# Patient Record
Sex: Male | Born: 1945
Health system: Southern US, Community
[De-identification: ages and names within clinical notes are randomized; demographics above are authoritative.]

## PROBLEM LIST (undated history)

## (undated) DIAGNOSIS — I2510 Atherosclerotic heart disease of native coronary artery without angina pectoris: Secondary | ICD-10-CM

## (undated) DIAGNOSIS — R002 Palpitations: Secondary | ICD-10-CM

## (undated) DIAGNOSIS — N419 Inflammatory disease of prostate, unspecified: Secondary | ICD-10-CM

## (undated) DIAGNOSIS — I10 Essential (primary) hypertension: Secondary | ICD-10-CM

## (undated) DIAGNOSIS — E785 Hyperlipidemia, unspecified: Secondary | ICD-10-CM

## (undated) DIAGNOSIS — K219 Gastro-esophageal reflux disease without esophagitis: Secondary | ICD-10-CM

## (undated) DIAGNOSIS — I493 Ventricular premature depolarization: Secondary | ICD-10-CM

## (undated) HISTORY — DX: Essential (primary) hypertension: I10

## (undated) HISTORY — DX: Atherosclerotic heart disease of native coronary artery without angina pectoris: I25.10

## (undated) HISTORY — DX: Hyperlipidemia, unspecified: E78.5

## (undated) HISTORY — PX: TONSILLECTOMY: SUR1361

## (undated) HISTORY — DX: Ventricular premature depolarization: I49.3

## (undated) HISTORY — PX: OTHER SURGICAL HISTORY: SHX169

## (undated) HISTORY — DX: Palpitations: R00.2

## (undated) HISTORY — PX: APPENDECTOMY: SHX54

## (undated) HISTORY — DX: Inflammatory disease of prostate, unspecified: N41.9

## (undated) HISTORY — DX: Gastro-esophageal reflux disease without esophagitis: K21.9

---

## 1999-07-25 ENCOUNTER — Ambulatory Visit (HOSPITAL_COMMUNITY): Admission: RE | Admit: 1999-07-25 | Discharge: 1999-07-25 | Payer: Self-pay | Admitting: Gastroenterology

## 1999-10-17 ENCOUNTER — Ambulatory Visit (HOSPITAL_COMMUNITY): Admission: RE | Admit: 1999-10-17 | Discharge: 1999-10-17 | Payer: Self-pay | Admitting: Gastroenterology

## 1999-10-17 ENCOUNTER — Encounter: Payer: Self-pay | Admitting: Gastroenterology

## 2001-12-19 ENCOUNTER — Ambulatory Visit (HOSPITAL_COMMUNITY): Admission: RE | Admit: 2001-12-19 | Discharge: 2001-12-19 | Payer: Self-pay | Admitting: Family Medicine

## 2001-12-19 ENCOUNTER — Encounter: Payer: Self-pay | Admitting: Family Medicine

## 2003-08-02 ENCOUNTER — Encounter (INDEPENDENT_AMBULATORY_CARE_PROVIDER_SITE_OTHER): Payer: Self-pay | Admitting: *Deleted

## 2003-08-02 ENCOUNTER — Ambulatory Visit (HOSPITAL_COMMUNITY): Admission: RE | Admit: 2003-08-02 | Discharge: 2003-08-02 | Payer: Self-pay | Admitting: Gastroenterology

## 2005-10-11 ENCOUNTER — Ambulatory Visit: Payer: Self-pay | Admitting: Infectious Diseases

## 2005-10-11 ENCOUNTER — Observation Stay (HOSPITAL_COMMUNITY): Admission: EM | Admit: 2005-10-11 | Discharge: 2005-10-13 | Payer: Self-pay | Admitting: Emergency Medicine

## 2005-10-12 ENCOUNTER — Encounter: Payer: Self-pay | Admitting: Cardiology

## 2005-10-12 ENCOUNTER — Ambulatory Visit: Payer: Self-pay | Admitting: Cardiology

## 2005-10-27 ENCOUNTER — Ambulatory Visit (HOSPITAL_COMMUNITY): Admission: RE | Admit: 2005-10-27 | Discharge: 2005-10-27 | Payer: Self-pay | Admitting: Gastroenterology

## 2005-10-28 ENCOUNTER — Encounter: Admission: RE | Admit: 2005-10-28 | Discharge: 2005-10-28 | Payer: Self-pay | Admitting: Gastroenterology

## 2006-06-01 ENCOUNTER — Emergency Department (HOSPITAL_COMMUNITY): Admission: EM | Admit: 2006-06-01 | Discharge: 2006-06-01 | Payer: Self-pay | Admitting: Family Medicine

## 2006-08-15 IMAGING — US US ABDOMEN COMPLETE
1 series · 14 of 25 positions shown · non-contrast
Comparison: none

CLINICAL DATA: Abdominal pain. Reflux. 
 ABDOMINAL ULTRASOUND COMPLETE:
TECHNIQUE: Complete abdominal ultrasound examination was performed including evaluation of the liver, gallbladder, bile ducts, pancreas, kidneys, spleen, IVC, and abdominal aorta.

[Series 1: us abdomen complete · 0.37mm/px · 14 of 62 slices shown]
[im 1/62]
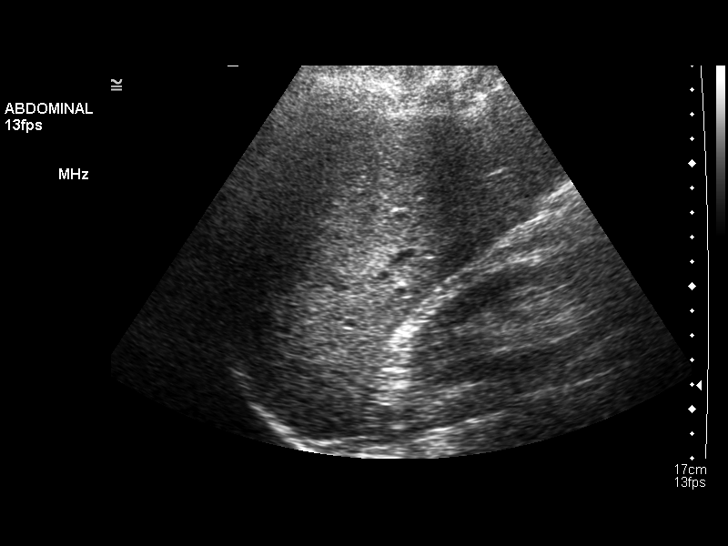
[im 6/62]
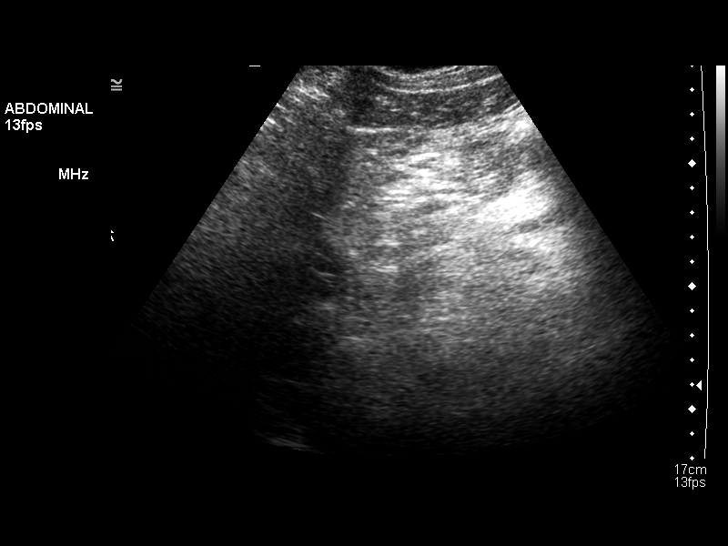
[im 11/62]
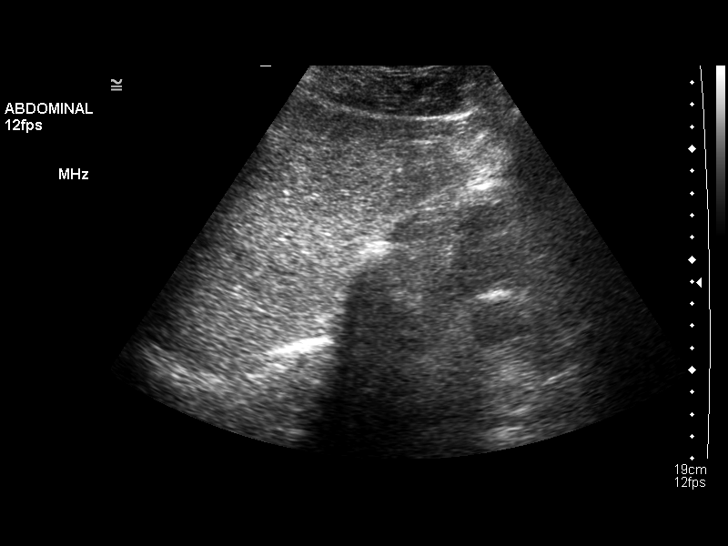
[im 16/62]
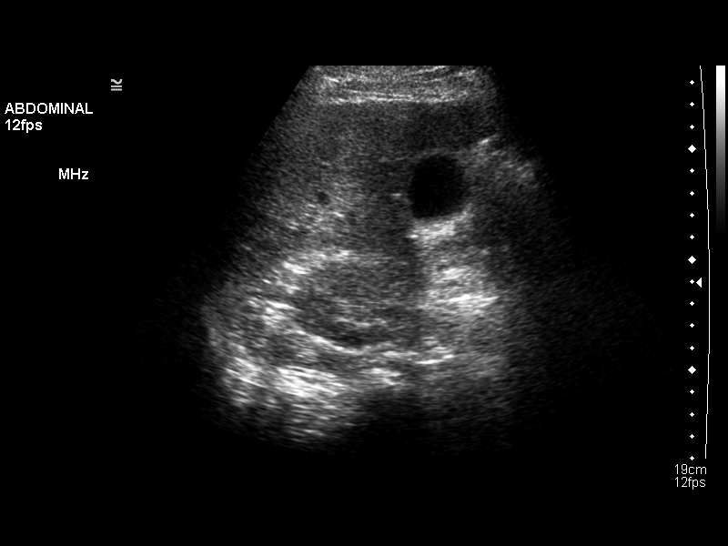
[im 21/62]
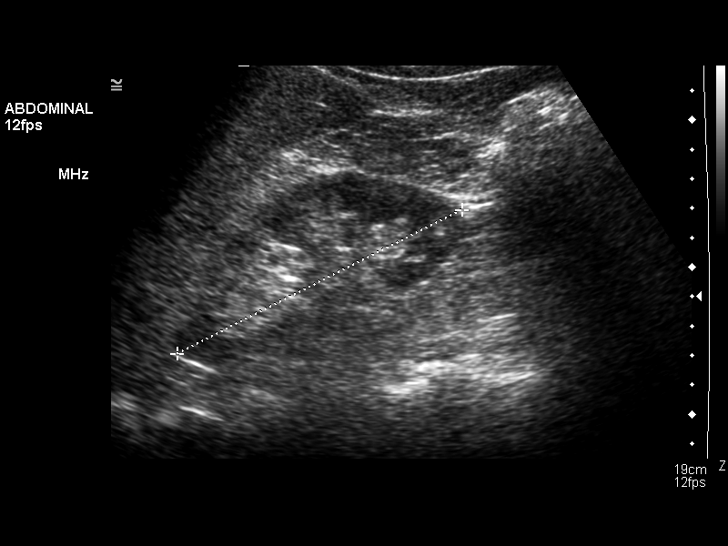
[im 23/62]
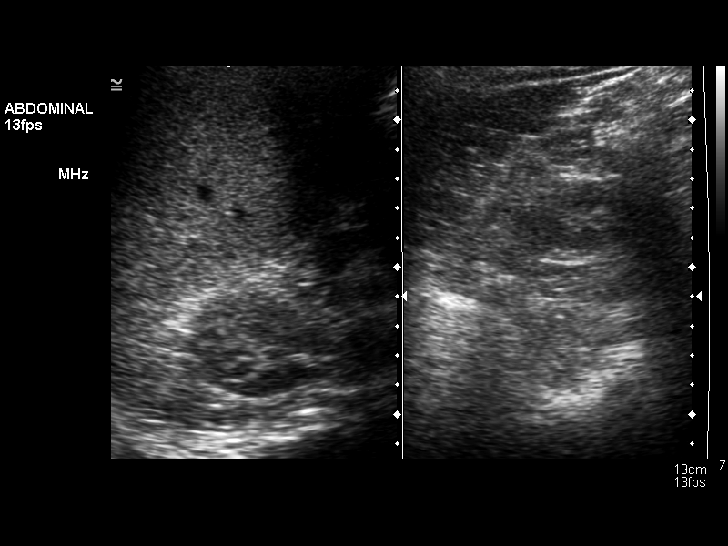
[im 28/62]
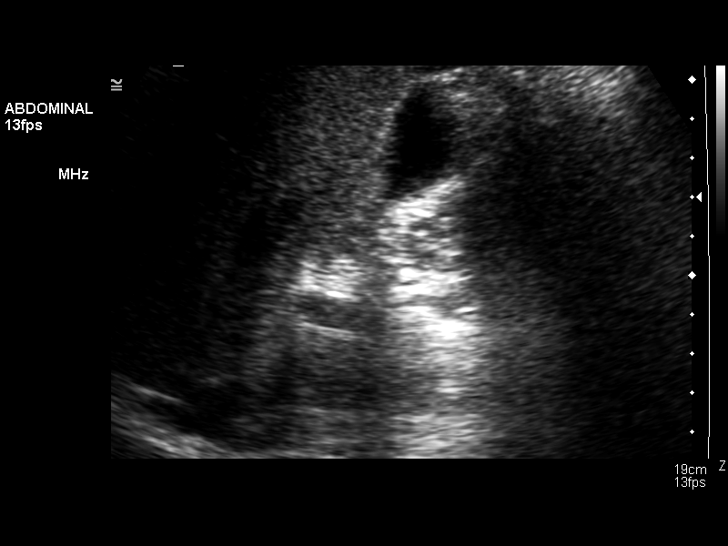
[im 34/62]
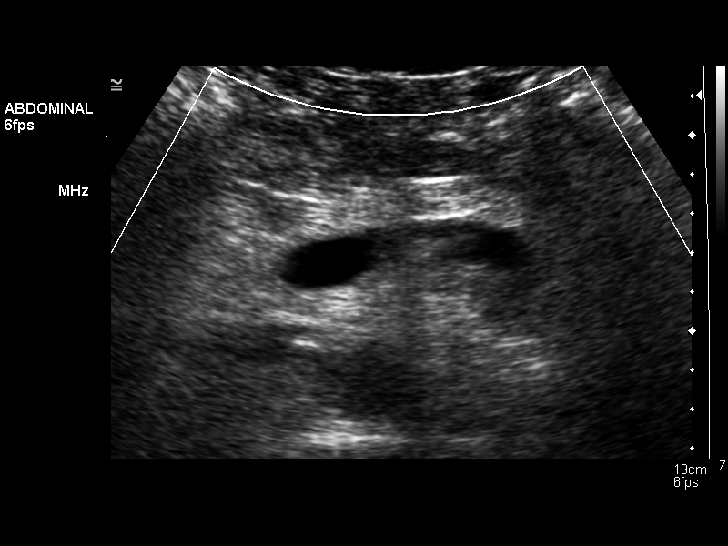
[im 39/62]
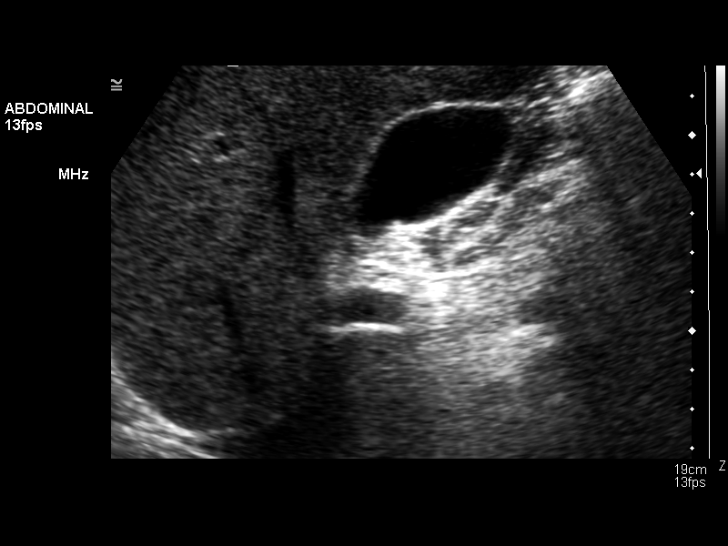
[im 41/62]
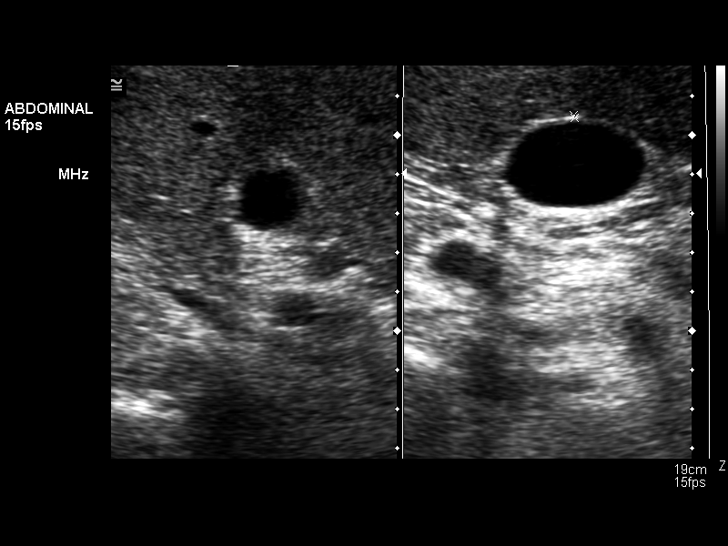
[im 46/62]
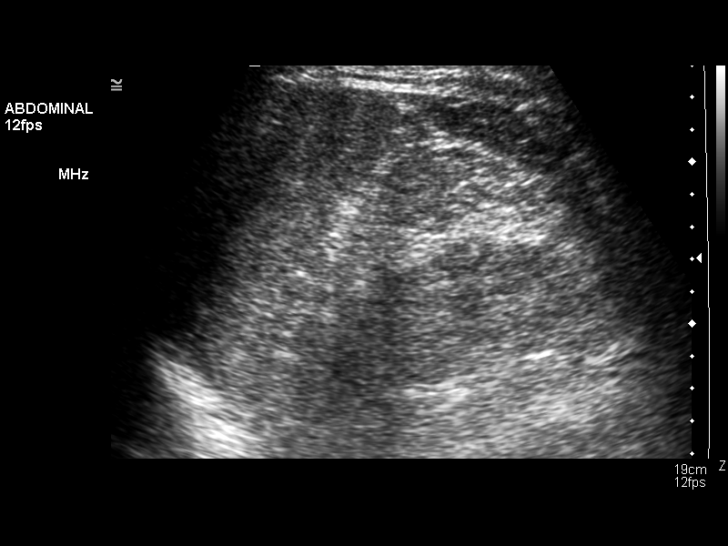
[im 51/62]
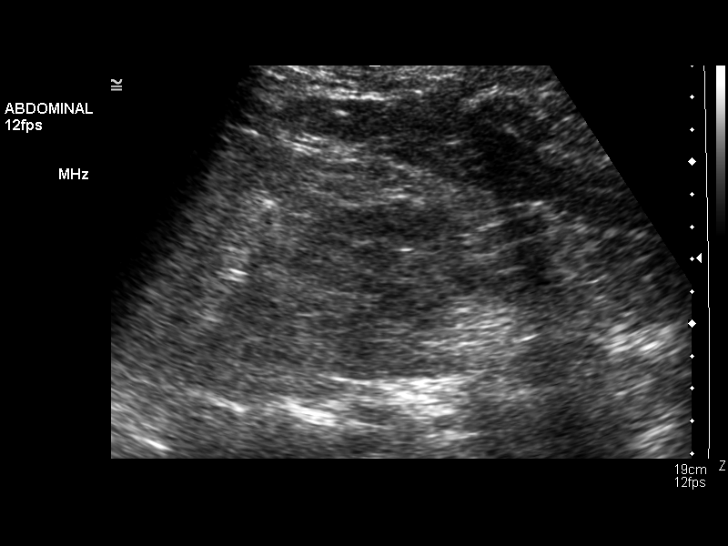
[im 56/62]
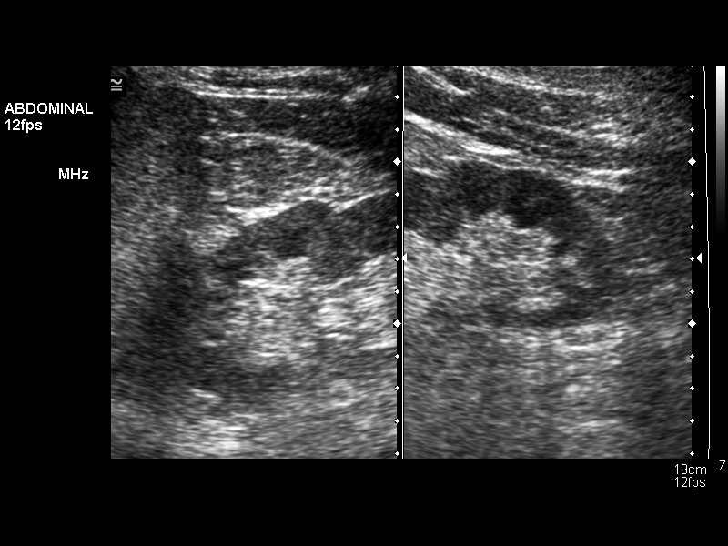
[im 62/62]
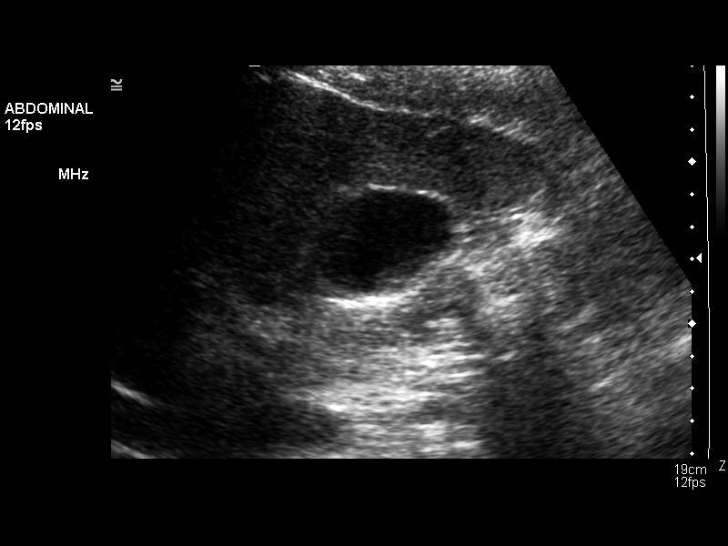

[14 of 25 positions shown; findings below may reference images not displayed]

FINDINGS: Scans over the upper abdomen were performed.  The gallbladder is well seen and no gallstones are noted.   The liver has increased echogenicity consistent with fatty infiltration.  The common bile duct is normal measuring .7 mm in diameter.  Assessment of the IVC, pancreas and spleen is limited by bowel gas.  No hydronephrosis is seen.  The right kidney measures 10.9 cm sagittally with the left kidney measuring 10.4 cm.  The abdominal aorta is normal in caliber.
IMPRESSION: 1.  Fatty infiltration of the liver.   No gallstones. 
 2.  Pancreas and spleen not well seen due to bowel gas.

## 2007-09-28 ENCOUNTER — Ambulatory Visit (HOSPITAL_COMMUNITY): Admission: RE | Admit: 2007-09-28 | Discharge: 2007-09-28 | Payer: Self-pay | Admitting: Gastroenterology

## 2007-12-01 HISTORY — PX: US ECHOCARDIOGRAPHY: HXRAD669

## 2007-12-01 HISTORY — PX: OTHER SURGICAL HISTORY: SHX169

## 2008-09-25 ENCOUNTER — Ambulatory Visit: Payer: Self-pay | Admitting: Family Medicine

## 2008-09-25 ENCOUNTER — Observation Stay (HOSPITAL_COMMUNITY): Admission: EM | Admit: 2008-09-25 | Discharge: 2008-09-26 | Payer: Self-pay | Admitting: Emergency Medicine

## 2008-09-26 ENCOUNTER — Encounter: Payer: Self-pay | Admitting: Family Medicine

## 2011-03-01 HISTORY — PX: CARDIAC CATHETERIZATION: SHX172

## 2011-03-24 ENCOUNTER — Telehealth: Payer: Self-pay | Admitting: Cardiology

## 2011-03-24 NOTE — Telephone Encounter (Signed)
Agree with plan 

## 2011-03-24 NOTE — Telephone Encounter (Signed)
When I called patient back he stated his heart rate was back to normal.  He stated he ran around the building and it went back to normal and that he would discontinue his caffeine completely and if it happened again he would call back.  Stated he would just watch for about a week and call back if needed anything.

## 2011-03-24 NOTE — Telephone Encounter (Signed)
Patient wants to know if he should come in for his PVC sx's.  He wants to speak with RN to see if he should just wait another 24 hrs.

## 2011-03-30 ENCOUNTER — Telehealth: Payer: Self-pay | Admitting: Cardiology

## 2011-03-30 ENCOUNTER — Inpatient Hospital Stay (HOSPITAL_COMMUNITY)
Admission: EM | Admit: 2011-03-30 | Discharge: 2011-03-31 | DRG: 392 | Disposition: A | Discharge status: Home / Self Care | Payer: 59 | Source: Ambulatory Visit | Attending: Cardiology | Admitting: Cardiology

## 2011-03-30 ENCOUNTER — Encounter: Payer: Self-pay | Admitting: Nurse Practitioner

## 2011-03-30 ENCOUNTER — Ambulatory Visit (INDEPENDENT_AMBULATORY_CARE_PROVIDER_SITE_OTHER): Payer: 59 | Admitting: Nurse Practitioner

## 2011-03-30 ENCOUNTER — Inpatient Hospital Stay (HOSPITAL_COMMUNITY): Payer: 59

## 2011-03-30 ENCOUNTER — Encounter: Payer: Self-pay | Admitting: *Deleted

## 2011-03-30 VITALS — BP 120/82 | HR 70 | Wt 200.0 lb

## 2011-03-30 DIAGNOSIS — I10 Essential (primary) hypertension: Secondary | ICD-10-CM | POA: Diagnosis present

## 2011-03-30 DIAGNOSIS — K219 Gastro-esophageal reflux disease without esophagitis: Principal | ICD-10-CM | POA: Diagnosis present

## 2011-03-30 DIAGNOSIS — R079 Chest pain, unspecified: Secondary | ICD-10-CM

## 2011-03-30 DIAGNOSIS — R0789 Other chest pain: Secondary | ICD-10-CM | POA: Diagnosis present

## 2011-03-30 DIAGNOSIS — I2510 Atherosclerotic heart disease of native coronary artery without angina pectoris: Secondary | ICD-10-CM | POA: Diagnosis present

## 2011-03-30 DIAGNOSIS — E785 Hyperlipidemia, unspecified: Secondary | ICD-10-CM | POA: Diagnosis present

## 2011-03-30 DIAGNOSIS — Z7982 Long term (current) use of aspirin: Secondary | ICD-10-CM

## 2011-03-30 HISTORY — DX: Atherosclerotic heart disease of native coronary artery without angina pectoris: I25.10

## 2011-03-30 LAB — CBC
HCT: 43.3 % (ref 39.0–52.0)
Hemoglobin: 14.8 g/dL (ref 13.0–17.0)
MCH: 30.5 pg (ref 26.0–34.0)
MCHC: 34.2 g/dL (ref 30.0–36.0)
MCV: 89.3 fL (ref 78.0–100.0)
Platelets: 176 10*3/uL (ref 150–400)
RBC: 4.85 MIL/uL (ref 4.22–5.81)
RDW: 13.5 % (ref 11.5–15.5)
WBC: 6.1 10*3/uL (ref 4.0–10.5)

## 2011-03-30 LAB — DIFFERENTIAL
Basophils Relative: 0 % (ref 0–1)
Eosinophils Absolute: 0.1 10*3/uL (ref 0.0–0.7)
Eosinophils Relative: 2 % (ref 0–5)
Lymphocytes Relative: 16 % (ref 12–46)
Lymphs Abs: 1 10*3/uL (ref 0.7–4.0)
Monocytes Absolute: 0.6 10*3/uL (ref 0.1–1.0)
Monocytes Relative: 11 % (ref 3–12)
Neutro Abs: 4.3 10*3/uL (ref 1.7–7.7)
Neutrophils Relative %: 71 % (ref 43–77)

## 2011-03-30 LAB — CARDIAC PANEL(CRET KIN+CKTOT+MB+TROPI)
CK, MB: 1.2 ng/mL (ref 0.3–4.0)
Relative Index: 1.5 (ref 0.0–2.5)
Relative Index: INVALID (ref 0.0–2.5)
Total CK: 101 U/L (ref 7–232)
Total CK: 82 U/L (ref 7–232)
Troponin I: <0.01 ng/mL (ref 0.00–0.06)
Troponin I: 0.01 ng/mL (ref 0.00–0.06)

## 2011-03-30 LAB — COMPREHENSIVE METABOLIC PANEL
ALT: 17 U/L (ref 0–53)
AST: 23 U/L (ref 0–37)
Albumin: 3.6 g/dL (ref 3.5–5.2)
Alkaline Phosphatase: 70 U/L (ref 39–117)
BUN: 15 mg/dL (ref 6–23)
CO2: 22 mEq/L (ref 19–32)
Chloride: 110 mEq/L (ref 96–112)
Creatinine, Ser: 1 mg/dL (ref 0.4–1.5)
GFR calc Af Amer: >60 mL/min (ref >60)
GFR calc non Af Amer: >60 mL/min (ref >60)
Glucose, Bld: 93 mg/dL (ref 70–99)
Potassium: 4 mEq/L (ref 3.5–5.1)
Sodium: 139 mEq/L (ref 135–145)
Total Bilirubin: 0.5 mg/dL (ref 0.3–1.2)
Total Protein: 6.5 g/dL (ref 6.0–8.3)

## 2011-03-30 LAB — APTT: aPTT: 30 seconds (ref 24–37)

## 2011-03-30 LAB — PROTIME-INR: INR: 0.95 (ref 0.00–1.49)

## 2011-03-30 NOTE — Progress Notes (Signed)
   Randy Wise Date of Birth: 04-May-1946   History of Present Illness: Randy Wise is seen today for a work in visit. He is seen for Dr. Patty Sermons. He has not been feeling well over the last 2 weeks. He has gotten progressively worse over the last 2 days. He has exertional chest tightness. It has been associated with severe sense of fatigue. It is described as a tightness and like indigestion. He has not eaten anything to give him indigestion. Overall, he does not feel well and does not have a sense of well being. He has been nauseasted off and on. He has some shortness of breath. His symptoms are relieved with rest and clearly brought on by exertion.  No radiation of the symptoms. He has not used NTG. He has a history of PVC's but notes that his heart rate is currently smooth, yet he remains uncomfortable. Yesterday he attempted to walk up a hill and broke out in a cold sweat.   Current Outpatient Prescriptions on File Prior to Visit  Medication Sig Dispense Refill  . aspirin 81 MG tablet Take 81 mg by mouth daily.        [name] omeprazole (PRILOSEC OTC) 20 MG tablet Take 20 mg by mouth daily.          No Known Allergies  Past Medical History  Diagnosis Date  . Palpitations   . Chest pain   . Prostatitis   . HTN (hypertension)     Past Surgical History  Procedure Date  . Tonsillectomy   . Appendectomy   . Fistula removal   . Nuclear stress test 2009    Ef-63%, Normal  . US echocardiography 2009    Normal, EF-65%    History  Smoking status  . Never Smoker   Smokeless tobacco  . Not on file    History  Alcohol Use No    Family History  Problem Relation Age of Onset  . Hypertension Brother   . Heart disease Father   . Heart attack Father     Review of Systems: The review of systems is positive as above.  All other systems were reviewed and are negative.  Physical Exam: BP 120/82  Pulse 70  Wt 200 lb (90.719 kg) Patient is pleasant and appears mildly  distressed.  He keeps saying that something doesn't feel right. Skin is warm and dry. Color is normal.  HEENT is unremarkable. Normocephalic/atraumatic. PERRL. Sclera are nonicteric. Neck is supple. No masses. No JVD. Lungs are clear. Cardiac exam shows a regular rate and rhythm. Abdomen is soft. Extremities are without edema. Gait and ROM are intact. No gross neurologic deficits noted.  LABORATORY DATA: EKG shows sinus. There are no acute changes. Tracing is reviewed with Dr. Patty Sermons. ? Changes in V2   Assessment / Plan:

## 2011-03-30 NOTE — Assessment & Plan Note (Signed)
Patient was given NTG x 1 dose with some relief. He does not "feel quite right". Plan of care is reviewed with Dr. Patty Sermons. We will admit to the hospital. We will make arrangements for him to have cardiac cath today. Orders are written. Patient is transported per EMS.

## 2011-03-30 NOTE — Telephone Encounter (Signed)
Worked in with Lori today 

## 2011-03-30 NOTE — Telephone Encounter (Signed)
PATIENT SAID HE HAS BEEN HAVE PALPS OFTEN AND NOW CONCERNED. HE WENT WITHOUT CAFFIENE ALL WEEKEND BUT DOES NOT SEEM TO HAVE MADE A DIFFERENCE. CHART PLACED IN BOX.

## 2011-03-31 LAB — CBC
HCT: 41.5 % (ref 39.0–52.0)
MCH: 29.9 pg (ref 26.0–34.0)
MCHC: 34 g/dL (ref 30.0–36.0)
MCV: 88.1 fL (ref 78.0–100.0)
Platelets: 164 10*3/uL (ref 150–400)
RBC: 4.71 MIL/uL (ref 4.22–5.81)
RDW: 13.5 % (ref 11.5–15.5)
WBC: 4.4 10*3/uL (ref 4.0–10.5)

## 2011-03-31 LAB — BASIC METABOLIC PANEL
Calcium: 8.7 mg/dL (ref 8.4–10.5)
Chloride: 108 mEq/L (ref 96–112)
Creatinine, Ser: 1.09 mg/dL (ref 0.4–1.5)
GFR calc Af Amer: >60 mL/min (ref >60)
GFR calc non Af Amer: >60 mL/min (ref >60)
Potassium: 4.3 mEq/L (ref 3.5–5.1)

## 2011-03-31 LAB — CARDIAC PANEL(CRET KIN+CKTOT+MB+TROPI)
CK, MB: 1.3 ng/mL (ref 0.3–4.0)
Relative Index: INVALID (ref 0.0–2.5)
Total CK: 85 U/L (ref 7–232)
Troponin I: 0.01 ng/mL (ref 0.00–0.06)

## 2011-03-31 LAB — LIPID PANEL
Cholesterol: 157 mg/dL (ref 0–200)
HDL: 32 mg/dL — ABNORMAL LOW (ref >39)
LDL Cholesterol: 107 mg/dL — ABNORMAL HIGH (ref 0–99)
Total CHOL/HDL Ratio: 4.9 RATIO

## 2011-03-31 LAB — HEPARIN LEVEL (UNFRACTIONATED): Heparin Unfractionated: <0.1 IU/mL — ABNORMAL LOW (ref 0.30–0.70)

## 2011-04-01 NOTE — Cardiovascular Report (Signed)
  NAME:  Randy Wise, Randy Wise NO.:  [id number]  MEDICAL RECORD NO.:  [id number]           PATIENT TYPE:  I  LOCATION:  2010                         FACILITY:  [facility]  PHYSICIAN:  Verne Carrow, MDDATE OF BIRTH:  Dec 07, 1945  DATE OF PROCEDURE:  03/30/2011 DATE OF DISCHARGE:                           CARDIAC CATHETERIZATION   PRIMARY CARDIOLOGIST:  Cassell Clement, MD  PROCEDURE PERFORMED: 1. Left heart catheterization. 2. Selective coronary angiography. 3. Left ventricular angiogram.  OPERATOR:  Verne Carrow, MD  ARTERIAL ACCESS SITE:  Right radial artery.  INDICATIONS:  Chest pain.  DETAILS OF PROCEDURE:  The patient was brought to the main cardiac catheterization laboratory after signing informed consent for the procedure.  An Freida Busman test was performed on the right wrist and was positive.  The right wrist was prepped and draped in sterile fashion. Lidocaine 1% was used for local anesthesia.  A 5-French sheath was inserted into the right radial artery without difficulty.  Verapamil 3 mg was given through the sheath after insertion.  Intravenous heparin 4000 units was given after sheath insertion.  Standard diagnostic catheters were used to perform selective coronary angiography.  A pigtail catheter was used to perform a left ventricular angiogram.  The patient tolerated the procedure well.  There were no immediate complications.  The sheath was removed from the right radial artery and a Terumo hemostasis band was applied over the arteriotomy site.  The patient was taken to the recovery area in stable condition.  HEMODYNAMIC FINDINGS:  Central aortic pressure 105/62.  Left ventricular pressure 107/9/15.  ANGIOGRAPHIC FINDINGS: 1. The left main coronary artery was short and had no disease. 2. The left anterior descending was a large vessel that coursed to the     apex and gave off a moderate-sized diagonal branch.  There were     mild luminal  irregularities throughout the proximal and mid vessel     but no focally obstructive lesions.  The diagonal branch had mild     plaque disease. 3. Circumflex artery gave off a large bifurcating first obtuse     marginal branch.  The AV groove circumflex beyond this was small in     caliber.  There was mild plaque noted in this system. 4. The right coronary artery is a large dominant vessel with no     evidence of disease. 5. Left ventricular angiogram was performed in the RAO projection and     showed normal left ventricular systolic function with ejection     fraction of 65%.  IMPRESSION: 1. Mild nonobstructive coronary artery disease. 2. Normal left ventricular systolic function.  RECOMMENDATIONS:  The patient should be treated with an aspirin and a statin.  He will be watched closely tonight and discharged to home tomorrow if stable.     Verne Carrow, MD     CM/MEDQ  D:  03/30/2011  T:  03/31/2011  Job:  [id number]  cc:   Cassell Clement, M.D.  Electronically Signed by Verne Carrow MD on 04/01/2011 02:11:32 PM

## 2011-04-07 NOTE — Discharge Summary (Addendum)
  NAMEDEO, MEHRINGER NO.:  [id number]  MEDICAL RECORD NO.:  [id number]           PATIENT TYPE:  I  LOCATION:  2010                         FACILITY:  [facility]  PHYSICIAN:  Cassell Clement, M.D. DATE OF BIRTH:  07-24-1946  DATE OF ADMISSION:  03/30/2011 DATE OF DISCHARGE:  03/31/2011                              DISCHARGE SUMMARY   Electronically Signed by Ronie Spies  on 04/07/2011 01:09:17 PM Electronically Signed by Cassell Clement M.D. on 04/13/2011 04:42:18 AM

## 2011-04-07 NOTE — Discharge Summary (Addendum)
[name], [name]:  [id number]  MEDICAL RECORD NO.:  [id number]           PATIENT TYPE:  I  LOCATION:  2010                         FACILITY:  [facility]  PHYSICIAN:  Cassell Clement, M.D. DATE OF BIRTH:  Jan 16, 1946  DATE OF ADMISSION:  03/30/2011 DATE OF DISCHARGE:  03/31/2011                              DISCHARGE SUMMARY   DISCHARGE DIAGNOSES: 1. Chest pain, possibly related to gastroesophageal reflux disease     plus or minus musculoskeletal chest pain. 2. Mild nonobstructive coronary artery disease by catheterization on     March 30, 2011, with normal left ventricular function.     a.     Initiated on statin, continued on aspirin.  Not on beta-      blocker secondary to baseline bradycardia. 3. Hyperlipidemia.  Total cholesterol 157, triglycerides 89, HDL 32,     LDL 107.     a.     Initiated on Crestor. 4. History of premature ventricular contractions. 5. Status post tonsillectomy. 6. Status post appendectomy. 7. Status post fistula removal.  HISTORY OF PRESENT ILLNESS:  [name] is a 65 year old gentleman with a history of GERD, prior hypertension, and normal stress test in 2009 and normal echo who presented to the office complaining of exertional chest discomfort with associated fatigue, described like indigestion-type sensation.  He broke out into cold sweat while walking up a hill.  His symptoms are concerning for stable angina, so he was brought to the [facility] for further evaluation.  Cardiac enzymes were cycled, which were negative x3.  He underwent cardiac catheterization to define his anatomy on March 30, 2011, demonstrating mild nonobstructive CAD with normal LV function.  He was initiated on statin for hyperlipidemia and his aspirin was continued.  He was initially written to receive beta blockade, however, had baseline bradycardia precluding use of this.  Dr. Patty Sermons has seen and examined him today and feels he is  stable for discharge.  In retrospect, the patient does wonder whether or not he pulled the muscle from using a chainsaw, but also relates that these symptoms are similar to prior indigestion and GERD.  Dr. Patty Sermons would like him to continue on his aspirin, Crestor, and over-the-counter Prilosec.  DISCHARGE LABORATORIES:  Cardiac enzymes negative x3.  Total cholesterol 157, triglyceride 89, HDL 32, LDL 107.  Sodium 140, potassium 4.3, chloride 108, CO2 of 28, glucose 104, BUN 9, creatinine 1.09.  LFTs were normal on March 30, 2011.  INR 0.95.  WBC 4.4, hemoglobin 14.1, hematocrit 41.5, platelet count 164.  STUDIES: 1. Chest x-ray on March 30, 2011, showed normal heart size and clear     lungs. 2. Cardiac catheterization on March 30, 2011, please see full report     for details as well as HPI for summary.  DISCHARGE MEDICATIONS: 1. Nitroglycerin sublingual 0.4 mg every 5 minutes as needed up to 3     doses for chest pain. 2. Crestor 10 mg daily. 3. Aspirin 81 mg every morning. 4. Prilosec 20 mg daily.  Please note that he is on no beta-blocker secondary to bradycardia.  DISPOSITION:  [name] will  be discharged in stable condition to home.  He is instructed to increase activity slowly, not to lift anything over 5 pounds for 1 week, or participate in sexual activity for 1 week.  No driving for 3 days.  He is to follow a heart-healthy diet, and to call or return if he notices any pain, swelling, bleeding, or pus at the cath site.  He will follow up with Dr. Aida Puffer for continued monitoring of his lipid therapy.  He will have a post hospital visit with Norma Fredrickson, NP, on Apr 06, 2011, at 9:15 a.m.  DURATION OF DISCHARGE ENCOUNTER:  Greater than 30 minutes including physician and PA time.     Ronie Spies, P.A.C.   ______________________________ Cassell Clement, M.D.    DD/MEDQ  D:  03/31/2011  T:  04/01/2011  Job:  [id number]  cc:   Cassell Clement, M.D. Aida Puffer  Electronically Signed by Ronie Spies  on 04/07/2011 01:09:19 PM Electronically Signed by Cassell Clement M.D. on 04/13/2011 04:42:30 AM

## 2011-04-14 ENCOUNTER — Encounter: Payer: Self-pay | Admitting: Nurse Practitioner

## 2011-04-14 DIAGNOSIS — K219 Gastro-esophageal reflux disease without esophagitis: Secondary | ICD-10-CM | POA: Insufficient documentation

## 2011-04-14 DIAGNOSIS — E785 Hyperlipidemia, unspecified: Secondary | ICD-10-CM | POA: Insufficient documentation

## 2011-04-14 NOTE — H&P (Signed)
NAME:  JOSEPH, BIAS NO.:  [id number]   MEDICAL RECORD NO.:  [id number]          PATIENT TYPE:  OBV   LOCATION:  4703                         FACILITY:  [facility]   PHYSICIAN:  Santiago Bumpers. Hensel, M.D.DATE OF BIRTH:  01/14/1946   DATE OF ADMISSION:  09/25/2008  DATE OF DISCHARGE:                              HISTORY & PHYSICAL   CHIEF COMPLAINT:  Cardiac palpitations, diaphoresis, and chest pain.   HISTORY OF PRESENT ILLNESS:  Yesterday, the patient had barbeque and 3  caffeinated tea drinks.  Yesterday evening at about 2200, he developed  chest pressure and felt palpitations.  He said they were worse when he  laid on his left side.  They were better when he laid on his right and  on his back.  They felt even better on his back.  He had palpitations,  eventually went away overnight, but returned this morning while he was  at work.  He was doing inventory with lifting boxes and with climbing up  ladders.  While every time he would try to climb up in the ladder, he  would feel severe palpitations and diaphoresis.  It was relieved.  His  symptoms were relieved by rest.  He did not take any medication to  relieve his symptoms.  He has had these PVCs in the past usually related  to times where he is having reflux and increased caffeinated drinks, but  he has really not had them since 2007.  The only thing that was  different today about his palpitations was that he also felt nauseated.  He vomited x1 and had diarrhea x5.  His vomit was nonbilious, nonbloody.  His stool was normal, normal-colored stools, just a little bit watery,  but no blood, no melanotic.   PAST MEDICAL HISTORY:  Significant for GERD, for internal and external  hemorrhoids, for a history of noncardiac chest pain, for cardiomegaly on  chest x-ray, and for dysphasia with history of esophageal narrowing on  swallow eval and EGD.  He also had a current prostatitis infection that  he is being treated for.   He takes no medications other than the  antibiotics for the prostatitis, which he cannot remember the name of at  this time.   He has no known drug allergies.   He has a past surgical history of an appendectomy in childhood.   He has a family history of a father who had coronary artery disease and  diabetes who is living, 64 years old.  He had his first MI at 38 and has  had 5 coronary bypass surgeries.  He has a mother who has arthritis who  is living.  She is 64 years old.  Other than that, everyone is healthy.   SOCIAL HISTORY:  He lives with his wife, his daughter, and his 2  grandchildren.  He denies use of alcohol, smoking, and drugs.  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   REVIEW OF SYSTEMS:  Only positive for increased urination.  Otherwise,  he is negative for chest pain, shortness of breath, bloody diarrhea, or  bilious vomit.   PHYSICAL EXAMINATION:  VITAL SIGNS:  Temperature 98.7, heart rate 62,  blood pressure 112/69, oxygen saturation was 100% on room air.  HEENT:  He was normocephalic, atraumatic.  He wears glasses.  His pupils  were equal, round, and reactive to light.  Vision was grossly intact.  His nares were patent.  His tympanic membranes were gray, nonbulging.  His mucous membranes were moist.  He had fair dentition.  NECK:  Supple.  CARDIOVASCULAR:  He had a regular rate and rhythm.  No murmurs.  Normal  S1 and S2.  No rubs or gallops.  ABDOMEN:  He is overweight.  He has positive bowel sounds.  No  tenderness to palpation.  No organomegaly.  EXTREMITIES.  He had positive pulses.  No edema.  Warm and well  perfused.  NEURO:  He is alert and oriented x3.  No neuro deficits.   On his labs, his sodium was 141, potassium 4.4, chloride 109, bicarb 25,  BUN 10, creatinine 1.06, and he had a blood glucose of 101.  His calcium  was 9.4.  His magnesium 2.3.  He had point-of-care cardiac enzymes that  were negative with a troponin I of 0.01,  total CK 165, CK-MB of 2.7, and  relative index of 1.6.  He had a CBC that showed a white blood cell  count of 5.4, hemoglobin 15, hematocrit 44.3, and platelets of 201.  Studies that were done in the ED consisted of chest x-ray that showed no  acute cardiopulmonary disease and an EKG x2.  One was normal sinus  rhythm and the other showed normal sinus rhythm with 3 PVCs.   ASSESSMENT AND PLAN:  This 65 year old male with palpitations,  diaphoresis, nausea, vomiting, and diarrhea.  1. Cardiovascular, palpitations.  The patient has a history of      palpitations.  We will work these up for some more recent causes      including myocardial infarction, which we will cycle his cardiac      enzymes x3, 8 hours apart.  We will also give the patient aspirin      and nitroglycerin.  We will risk stratify the patient by getting a      hemoglobin A1c and doing a fasting lipid panel in the morning.  He      will also have an echo in the morning.  These may be due to      dehydration, due to vomiting and diarrhea, so we gave the patient a      normal saline bolus.  The patient likely has gastroenteritis from      the barbeque could also be from increased caffeine intake.  He did      mention that he used to drink a lot of caffeine and had more      premature ventricular contractions at that time; however, he will      no matter what the cause will likely need followup with Cardiology.      He is to see a cardiologist named Dr. Cassell Clement that we will      try to get followup with.  2. Gastroesophageal reflux disease.  The patient has had reflux in the      past and was told that it could bring along palpitations.  He will      continue his home meds that he has not been taking, but he does      have prescription for Protonix 40 mg  b.i.d.  The patient also      mentions that he has prostatitis.  3. Prostatitis.  The patient is being treated with antibiotics, but he      could not remember the  name of the antibiotics.  His family will      phone in the name and the dosage of the antibiotic and we will      start him on that while he is in-house.  4. Fluids, electrolytes, nutrition/gastrointestinal:  With the patient      on Protonix and on a heart-healthy diet, he is on telemetry, but      will be allowed to ambulate ad lib.  5. Disposition.  Likely, the patient will go home tomorrow pending      normal results and resolution of his PVCs and provided that he has      a followup with Cardiology arranged.      Jamie Brookes, MD  Electronically Signed      Santiago Bumpers. Leveda Anna, M.D.  Electronically Signed    AS/MEDQ  D:  09/25/2008  T:  09/26/2008  Job:  [id number]

## 2011-04-14 NOTE — Op Note (Signed)
NAMETHI, SISEMORE NO.:  [id number]   MEDICAL RECORD NO.:  [id number]          PATIENT TYPE:  AMB   LOCATION:  ENDO                         FACILITY:  [facility]   PHYSICIAN:  Petra Kuba, M.D.    DATE OF BIRTH:  17-May-1946   DATE OF PROCEDURE:  09/28/2007  DATE OF DISCHARGE:                               OPERATIVE REPORT   PROCEDURE:  Colonoscopy.   INDICATION:  Patient with a history of colon polyps, family history of  colon cancer due for repeat screening.  Consent was signed after risks,  benefits, methods and options were thoroughly discussed multiple times  in the past.   MEDICINES USED:  Fentanyl 100 mcg, Versed 10 mg.   DESCRIPTION OF PROCEDURE:  Rectal inspection was pertinent for small  external hemorrhoids.  Digital exam was negative. The video pediatric  colonoscope was inserted and easily advanced around the colon to the  cecum.  This did require abdominal pressure but no position changes.  No  abnormalities were seen on insertion.  The cecum was identified by the  appendiceal orifice and the ileocecal valve. The scope was slowly  withdrawn.  The prep was adequate.  There was some liquid stool that  required washing and suctioning but on slow withdrawal through the colon  no abnormalities were seen, specifically no polyps, tumors, masses or  diverticula.  Once back in the rectum, anorectal pull-through and  retroflexion confirmed some small hemorrhoids. The scope was  straightened and readvanced a short ways up the left side of the colon,  air was suctioned, scope removed.  The patient tolerated the procedure  well.  There was no obvious immediate complication.   ENDOSCOPIC DIAGNOSES:  1. Internal and external small hemorrhoids.  2. Otherwise within normal limits to the cecum.   PLAN:  Happy to see back p.r.n. otherwise repeat colon screening in 5  years.           ______________________________  Petra Kuba, M.D.     MEM/MEDQ  D:   09/28/2007  T:  09/28/2007  Job:  [id number]   cc:   Windle Guard, M.D.  Fax: [phone]

## 2011-04-16 ENCOUNTER — Encounter: Payer: Self-pay | Admitting: Nurse Practitioner

## 2011-04-16 ENCOUNTER — Ambulatory Visit (INDEPENDENT_AMBULATORY_CARE_PROVIDER_SITE_OTHER): Payer: 59 | Admitting: Nurse Practitioner

## 2011-04-16 VITALS — BP 132/88 | HR 62 | Ht 66.0 in | Wt 202.4 lb

## 2011-04-16 DIAGNOSIS — E785 Hyperlipidemia, unspecified: Secondary | ICD-10-CM

## 2011-04-16 DIAGNOSIS — I2510 Atherosclerotic heart disease of native coronary artery without angina pectoris: Secondary | ICD-10-CM | POA: Insufficient documentation

## 2011-04-16 MED ORDER — ATORVASTATIN CALCIUM 10 MG PO TABS: 10.0000 mg | ORAL_TABLET | Freq: Every day | ORAL | Status: AC

## 2011-04-16 NOTE — Progress Notes (Signed)
    Randy Wise Date of Birth: [birth year]   History of Present Illness: [name] is seen today for a post hospital visit.He is seen for Dr. Patty Wise. He has done well post cath. He has mild nonobstructive CAD. He will be managed medically. He was started on PPI therapy and Crestor. He cannot afford the Crestor. He has had no recurrent chest pain. He was cathed via the radial artery and did fine. He has no complaint.  Current Outpatient Prescriptions on File Prior to Visit  Medication Sig Dispense Refill  . aspirin 81 MG tablet Take 81 mg by mouth daily.        . fish oil-omega-3 fatty acids 1000 MG capsule Take by mouth 2 (two) times daily.        . nitroGLYCERIN (NITROSTAT) 0.4 MG SL tablet Place 0.4 mg under the tongue every 5 (five) minutes as needed.        [name] omeprazole (PRILOSEC OTC) 20 MG tablet Take 20 mg by mouth daily.        [name] atorvastatin (LIPITOR) 10 MG tablet Take 1 tablet (10 mg total) by mouth daily.  30 tablet  11  . DISCONTD: rosuvastatin (CRESTOR) 10 MG tablet Take 10 mg by mouth daily.          No Known Allergies  Past Medical History  Diagnosis Date  . Palpitations   . Prostatitis   . HTN (hypertension)   . GERD (gastroesophageal reflux disease)   . Hyperlipidemia   . CAD (coronary artery disease) 03/30/11    Mild nonobstructive disease per cath    Past Surgical History  Procedure Date  . Tonsillectomy   . Appendectomy   . Fistula removal   . Nuclear stress test 2009    Ef-63%, Normal  . US echocardiography 2009    Normal, EF-65%  . Cardiac catheterization April 2012    mild nonobstructive CAD with normal LV function. Left ventricular angiogram was performed in the RAO projection and showed normal left ventricular systolic function with ejection fraction of 65%    History  Smoking status  . Never Smoker   Smokeless tobacco  . Never Used    History  Alcohol Use No    Family History  Problem Relation Age of Onset  . Hypertension  Brother   . Heart disease Father   . Heart attack Father     Review of Systems: The review of systems is as above.  All other systems were reviewed and are negative.  Physical Exam: BP 132/88  Pulse 62  Ht 5\' 6"  (1.676 m)  Wt 202 lb 6.4 oz (91.808 kg)  BMI 32.67 kg/m2 Patient is very pleasant and in no acute distress. Skin is warm and dry. Color is normal.  HEENT is unremarkable. Normocephalic/atraumatic. PERRL. Sclera are nonicteric. Neck is supple. No masses. No JVD. Lungs are clear. Cardiac exam shows a regular rate and rhythm. Abdomen is soft. Extremities are without edema. Right arm looks good Gait and ROM are intact. No gross neurologic deficits noted.  LABORATORY DATA:   Assessment / Plan:

## 2011-04-16 NOTE — Patient Instructions (Signed)
We will try Lipitor 10 mg daily for your cholesterol Go to the website [email or web address] to see if you can get it for $4. We will see you in about 6 weeks for labs only. See Dr. Patty Sermons in about 4 months.

## 2011-04-16 NOTE — Assessment & Plan Note (Signed)
He cannot afford Crestor. We will try him on Lipitor. I have asked him to go to their website and see if he can get this for $4. We will recheck labs in 6 weeks.

## 2011-04-16 NOTE — Assessment & Plan Note (Signed)
He is doing well. We will see him back in about 4 months.

## 2011-04-17 NOTE — Discharge Summary (Signed)
NAME:  Randy Wise, Randy Wise NO.:  [id number]   MEDICAL RECORD NO.:  [id number]          PATIENT TYPE:  OBV   LOCATION:  4703                         FACILITY:  [facility]   PHYSICIAN:  Santiago Bumpers. Hensel, M.D.DATE OF BIRTH:  [birth year]   DATE OF ADMISSION:  09/25/2008  DATE OF DISCHARGE:  09/26/2008                               DISCHARGE SUMMARY   PRIMARY CARE Julianah Marciel:  Windle Guard, MD   DISCHARGE DIAGNOSES:  1. Cardiovascular chest pain and palpitations.  2. Gastroesophageal reflux disease.  3. Prostatitis.   DISCHARGE MEDICATIONS:  1. Doxycycline 100 mg p.o. b.i.d.  2. Aspirin 81 mg p.o. daily.  3. Protonix 40 mg p.o. b.i.d.  4. Nitroglycerin 0.4 mg sublingual tabs for chest pain.   Of note, these were the medications that the patient came in on as well.   There were no consults during this hospitalization.   PROCEDURES:  On September 25, 2008, the patient had two EKGs which both  showed a normal sinus rhythm, one of them showed three PVCs as well.  He  also had a chest x-ray that showed no acute cardiopulmonary disease on  September 25, 2008.  The patient's labs showed a BMET that had a sodium of  141, a potassium of 4.4, chloride 109, bicarb 25, BUN 10 and creatinine  1.06.  He had a glucose of 101, calcium of 9.4, magnesium of 2.3.  He  had point-of-care enzymes that showed a troponin I of 0.01, total CK  165, CK-MB of 2.7 and relative index of 1.6.  He had a white blood cell  count of 5.4, hemoglobin of 15, a hematocrit of 44.3 and platelets of  201.   BRIEF HOSPITAL COURSE:  This is a patient who had been feeling some  heart palpitations in 94.  He came into the hospital as well as the day  of his hospitalization, he has had these palpitations.  In the past, he  had palpitations.  He got diaphoretic but this time, the patient also  had some nausea and vomiting and diarrhea with episodes of palpitations.  1. The heart palpitations.  The patient has a history  of these      especially when he drinks caffeine.  He had previously had quite a      bit of caffeine during the day prior to the hospitalization.  The      patient did have no further PVCs on telemetry.  He had cardiac      enzymes that were negative x3.  He had a hemoglobin A1c of 5.7 with      cholesterol of 172, triglycerides of 79, HDL of 34, LDL of 122 and      a VLDL of 16.  The patient also had an echo that was done that      showed an ejection fraction of 65%.  The patient probably had      gastroenteritis with an new/increased use of caffeine.  2. GERD.  The patient has gastroesophageal reflux disease at home.  We      continued him on his home Protonix 40  mg b.i.d.  3. Prostatitis.  The patient had previously been diagnosed with      prostatitis.  We continued him on his current antibiotic of      doxycycline 100 mg p.o. b.i.d. for treatment.  The patient was      advised to eat a heart-healthy diet, to not drink caffeine in the      future.  He is to follow up with his cardiologist, Dr. Patty Sermons,      as well as his PCP,      Dr. Jeannetta Nap.  He had a followup appointment with Dr. Patty Sermons with      phone number [phone] on October 19, 2008 at 2 o'clock p.m.  The      patient was discharged home in stable medical condition on September 26, 2008.      Jamie Brookes, MD  Electronically Signed      Santiago Bumpers. Leveda Anna, M.D.  Electronically Signed    AS/MEDQ  D:  10/10/2008  T:  10/11/2008  Job:  [id number]   cc:   Windle Guard, M.D.  Cassell Clement, M.D.

## 2011-04-17 NOTE — Discharge Summary (Signed)
NAMEAAMIR, MCLINDEN NO.:  [id number]   MEDICAL RECORD NO.:  [id number]          PATIENT TYPE:  INP   LOCATION:  2019                         FACILITY:  [facility]   PHYSICIAN:  Fransisco Hertz, M.D.  DATE OF BIRTH:  1946/10/20   DATE OF ADMISSION:  10/11/2005  DATE OF DISCHARGE:  10/13/2005                                 DISCHARGE SUMMARY   DISCHARGE DIAGNOSES:  1.  Chest pain, noncardiac (negative echocardiogram, Cardiolite, EKG, and      cardiac enzymes), question esophageal spasm.  2.  Gastroesophageal reflux disease.  3.  History of elevated LDL (126), rest of fasting lipid profile within      normal limits.  4.  History of internal and external hemorrhoids diagnosed on colonoscopy.  5.  Polypectomy on past colonoscopy (adenomatous polyps).  6.  History of appendectomy as a child.  7.  Cardiomegaly on chest x-ray.  8.  History of dysphagia with history of esophageal narrowing on swallowing      evaluation and EGD.   DISCHARGE MEDICATIONS:  1.  Protonix 40 mg b.i.d.  2.  Diltiazem 30 mg t.i.d. (for esophageal spasm).  3.  Nitroglycerin 0.4 mg sublingual every five minutes p.r.n. chest pain as      needed (for esophageal spasm).   PROCEDURES:  1.  On October 11, 2005, there was a chest x-ray demonstrating cardiomegaly      without acute infiltrates or edema.  2.  On October 12, 2005, a nuclear medicine Myoview (Cardiolite) was      performed which showed no evidence of ischemia or infarct.  Left      ventricular ejection fraction was estimated at 59%.  Left ventricular      wall motion was normal and end-diastolic volume was 102 mL with an end-      systolic volume of 42 mL in the left ventricle.  3.  A 2D echocardiogram was performed on October 12, 2005 which showed      normal overall left ventricular systolic function with the left      ventricular ejection fraction estimated at 55% to 65%.  There were no      left ventricular regional wall motion  abnormalities.  4.  Two EKGs were performed on October 11, 2005 and both were entirely      normal and normal sinus rhythm.  Normal sinus rhythm with no ST changes      at a rate of 65 and 69 with a normal access.  No ST changes or other      concerns.   CONSULTATIONS:  Dr. Peyton Najjar A. Freida Busman was consulted for Mr. Salasar's chest  pain and he recommended stress testing.  He also recommended followup on the  patient's borderline hypertension and he also recommended lifestyle  modifications for the patient's borderline elevations of LDL at 126 if in  fact he ended up not having cardiac disease, which he did not.   HISTORY AND PHYSICAL:  For a full H&P please consult chart, but in brief Mr.  Patras is a 65 year old white man with no cardiac history who  presented  with retrosternal chest pain for two weeks that did not resolve by  increasing his Prilosec.  He does have a history of gastroesophageal reflux  disease.  The pain had been constant and worsened with activity.  He awoke  the morning of presentation with severe 10/10 pain and shortness of breath.  He said that the pain radiated to his left inner thigh, but otherwise no  other radiation.  He also complained of nausea and some shortness of breath,  so he presented to the ED.  He said that he did not have any prior similar  episodes. The pain in the ED did respond to nitroglycerin.  He describes the  pain as cramping, substernal jabs that feel like a muscle cramp.   PHYSICAL EXAMINATION:  VITAL SIGNS:  Pulse 72, blood pressure 146/82,  temperature 97.2, respirations 18, saturating 99% on room air.  GENERAL:  He is alert and oriented.  He appeared anxious.  HEENT:  Pupils equal, round, and reactive to light and accommodation.  Extraocular movements were intact.  Oropharynx was clear.  No erythema.  No  exudate.  NECK:  Supple.  No JVD.  LUNGS:  Clear bilaterally with no wheezes.  No rhonchi.  CARDIOVASCULAR:  Revealed distant S1 and  S2.  No murmurs. Regular rate and  rhythm.  No chest tenderness to palpation of the sternum.  GASTROINTESTINAL:  Bowel sounds were present.  Abdomen was nontender,  nondistended.  Liver was normal size by percussion.  EXTREMITY:  No edema.  No cyanosis.  SKIN:  Warm and dry.  He had some pain in the distribution of someone who  goes out and plays golf.  MUSCULOSKELETAL:  5/5 strength in upper and lower extremities bilaterally.  NEUROLOGIC:  He was intact.  Cranial nerves II-XII grossly alert and  oriented and had an appropriate affect.   LABORATORY DATA:  Sodium 140, potassium 4.1, chloride 110, bicarb 26, BUN 9,  creatinine 1.1, glucose 118.  Hemoglobin 14.3, hematocrit 41.6, MCV 90.2.  White count 5.6, platelets 212.  Cardiac enzymes were within normal limits  with two sets of point of care markers and two sets of regular measurement  of CK, CK-MB, and troponins.  Bilirubin was 0.7, alk phos 75, AST 19, ALT  22, protein 6.1, albumin 3.5, calcium 8.8.  Chest x-ray showed cardiomegaly,  no edema, central airway thickening consistent with reactive airway disease.  Fasting lipid panel showed cholesterol of 185, LDL of 126, HDL 46, and  cholesterol/HDL ratio of 4.4 which puts him at less than the average risk  for a man his age.   HOSPITAL COURSE:  Problem 1. For his chest pain, the differential diagnosis  of course included acute MI as well as angina, but also included  costochondritis and abdominal etiologies such as peptic ulcer disease,  esophageal spasms, gastroesophageal reflux disease.  The patient suggested  that the pain was relieved by nitroglycerin and pointing therefore to more  of a cardiac etiology versus esophageal spasms.  Because the EKG and cardiac  enzymes were normal, we were more suspicious of an esophageal spasms being  the etiology, however we did rule out an acute MI with cardiac enzymes.  Because the chest x-ray showed mild cardiomegaly, we evaluated  cardiac function with echocardiogram and it was normal.  Also because of his age and  the fact that he has a positive family history of cardiac disease, the  pretest probability of this chest pain being cardiac in origin was  still  high enough for [place] to pursue a Cardiolite test and this was normal.  Because  all of these were normal it was felt that his pain was not cardiac in origin  and was instead more consistent with esophageal spasm.  We referred him to  Dr. Ewing Schlein who he has seen in the past for his esophageal narrowing.  He will  likely need an EGD or an upper GI series to further evaluate the possibility  that he has an esophageal process accounting for his chest pain.  Of note,  with the initiation of diltiazem he ceased to experience chest pain in the  hospital.  Therefore, he was discharged on 30 mg of diltiazem t.i.d. to  prevent the presumed esophageal spasms.  He was also given a prescription  for sublingual nitroglycerin in case an attack comes on.  This has the  double benefit of treating chest pain or cardiac or spasmodic origin.   Problem 2. Gastroesophageal reflux disease.  Mr. Grzywacz takes Prilosec  over-the-counter at home.  In the hospital, I put him on Protonix b.i.d.  He  probably needs an EGD in any case because of his longstanding esophageal  reflux disease to rule out Barrett's esophagus or any other chronic sequelae  of reflux.  He will follow up with Dr. Ewing Schlein and hopefully will get  evaluation of his esophagus in the near future.   Problem 3. Hypertension.  Mr. Simonis did not have a diagnosis of  hypertension when he presented to the hospital, however, he does not have a  primary care physician.  He has had one isolated measurement of hypertension  in the past.  He was slightly hypertensive during this hospitalization.  As  part of the treatment of presumptive acute MI, he did receive a beta-blocker  and then he was treated for presumed esophageal spasm he  received diltiazem.  Therefore, his blood pressure became quite normalized during the rest of the  hospitalization.  I hesitate to give him a diagnosis of hypertension,  however, without two independent measurements of his blood pressure in a non-  acute setting, it will be advisable for his primary care physician to  evaluate his blood pressure off of diltiazem or any other blood pressure  modifying agents in order to determine whether or not he has this diagnosis.   Problem 4. Elevation of LDL.  Mr. Staiger had mild elevated LDL in the  setting of normal HDL and total cholesterol and triglycerides.  His liver  function tests are normal, so it is possible that he could be treated and in  fact we plan to treat him if his cardiac workup turned out to be positive.  However, since it is negative and since he is active (plays golf) and  because he is reluctant to take medicines, we both agreed that he could  manage his hyperlipidemia through lifestyle modification.  I counseled him on which foods and which types of fat to avoid, cholesterol-containing foods  particularly, so that he could manage his elevated LDL on his own.  I also  advised him to exercise and eat more poly unsaturated fats.  On the day of  discharge, Mr. Krolak was without any complaints.   DISCHARGE EXAMINATION:  VITAL SIGNS:  Temperature 97.7, blood pressure 104-  111/60-76, pulse 50-65, respirations 20, saturations were greater than 97%  on room air.  GENERAL:  He was alert and oriented in no acute distress.  CARDIAC:  Soft S1 and S2.  No murmurs, rubs, or gallops.  Normal rate and  rhythm.  LUNGS:  Clear to auscultation bilaterally.  ABDOMEN:  Soft, nontender, nondistended.  EXTREMITIES:  Without edema, no calf tenderness.  NEUROLOGIC:  Grossly intact.   LABORATORY DATA:  Hemoglobin 15.6, hematocrit 44.8, white count 4.9,  platelets 226.  Sodium 141, potassium 4.4, chloride 107, bicarb 27, BUN 13,  creatinine  1.2, and glucose 113.  Calcium 9.2.   He was eager to be discharged after the Cardiolite turned out to be normal  and therefore he was discharged with followup with Dr. Clarene Duke, his primary  care physician, and Dr. Ewing Schlein, his gastroenterologist.  Because of his  hectic schedule, he decided to make these appointments on his own.      Clearance Coots, M.D.    ______________________________  Fransisco Hertz, M.D.    IN/MEDQ  D:  10/16/2005  T:  10/18/2005  Job:  [id number]   cc:   Aida Puffer  Fax: [phone]   Petra Kuba, M.D.  Fax: [phone]

## 2011-04-17 NOTE — Consult Note (Signed)
NAMEJACEON, Randy Wise NO.:  [id number]   MEDICAL RECORD NO.:  [id number]          PATIENT TYPE:  INP   LOCATION:  2019                         FACILITY:  [facility]   PHYSICIAN:  Marrian Salvage. Freida Busman, M.D. [facility] OF BIRTH:  May 22, 1946   DATE OF CONSULTATION:  10/12/2005  DATE OF DISCHARGE:                                   CONSULTATION   PRIMARY CARE PHYSICIAN:  None, he has no doctor.   CHIEF COMPLAINT:  Chest pain.   HISTORY OF PRESENT ILLNESS:  The patient is a 65 year old man with minimal  past medical history.  He has no known cardiac disease.  His father had a  myocardial infarction at age 85, but he has no early family history of heart  disease.  He does not smoke.  He has no known hypertension.  He has no  hyperlipidemia.  He was in his usual state of health until about the last 2-  3 weeks when he noticed some chest pain that primarily occurred while he was  playing golf.  He started taking some Prilosec, but had no improvement in  the pain.  It has been relatively constant and present even at times of  rest.  Yesterday on October 10, 2005 the patient had some worsening chest  pain.  He also felt a little bit weak.  Then at 1:00 a.m. on October 11, 2005 he awoke with 10/10 chest pain and shortness of breath.  He also had  some left inner thigh pain.  There was also some nausea.  He reported to the  emergency department.  His pain did not respond to nitroglycerin initially.  He described the pain as a cramping sensation.  He had some PVCs on his  monitor.  Ultimately EKG was within normal limits.  Cardiac markers were  negative.  Plan is for requested stress testing.   PAST MEDICAL HISTORY:  1.  Colonoscopy with polypectomy which was adenomatous polyp.  2.  GERD with hospitalization 15 years ago.  3.  Elevation of his blood pressure on a single measurement six months ago.  4.  Appendectomy.   ALLERGIES:  No known drug allergies.   CURRENT MEDICATIONS:   Prilosec OTC.   SOCIAL HISTORY:  The patient is married.  He works as an Nature conservation officer.  He has never smoked, does not use alcohol or drugs.  He specifically denies  cocaine.   FAMILY HISTORY:  Father had bypass grafting x5 vessels and an MI at age 52.  He also had diabetes.  Mother had angina.  He has some siblings with  hypertension.   REVIEW OF SYSTEMS:  Positive for chest pain, dyspnea, fatigue, nausea,  weakness, headache.  Otherwise a 14 point review of systems was negative in  detail.   PHYSICAL EXAMINATION:  Temperature 97, pulse 72, blood pressure 140/80,  saturation 99% on room air.  GENERAL:  He is well-appearing, no apparent distress.  No carotid bruits.  Thyroid within normal limits.  JVP flat.  LUNGS:  clear to auscultation bilaterally.  HEART:  Regular rate and rhythm with no murmurs and no  gallops.  ABDOMEN:  Soft, nontender, with no hepatomegaly.  EXTREMITIES:  Showed no edema.  He had good PT pulses.  NEUROLOGIC:  He was intact.   LABORATORY DATA:  Sodium 137, potassium 3.8, creatinine 1.2, glucose 105,  white blood cell count 6, hematocrit 41, platelets 212, LFTs normal, chest x-  ray showed mild cardiomegaly but no edema.  Troponins were negative x3 with  a CK of 113, TSH 1.5, LDL 126, HDL 47.   IMPRESSION:  A 65 year old man with somewhat typical chest pain.  He has no  history of coronary disease and minimal cardiac risk factors.   PLAN:  1.  Chest pain.  Given moderate risk, I think stress testing is most      appropriate way to go.  He can exercise.  Consequently we will keep him      n.p.o. past midnight, hold his beta-blocker and do exercise Cardiolite      in the a.m.  Based on these results, we will make decisions on further      management.  2.  Hypertension.  Borderline.  He should have followup as an outpatient to      see where he runs in the outpatient setting and then start medicines as      needed.  3.  Hyperlipidemia.  Borderline  elevation of the LDL at 126.  If he does not      have any evidence of cardiac disease, then it would be reasonable to      treat him with lifestyle modification.   Thank you for this consult.           ______________________________  Marrian Salvage. Freida Busman, M.D. [facility]     LAA/MEDQ  D:  10/11/2005  T:  10/12/2005  Job:  [id number]

## 2011-04-17 NOTE — Op Note (Signed)
NAME:  Randy Wise, Randy Wise                       ACCOUNT NO.:  [id number]   MEDICAL RECORD NO.:  [id number]                   PATIENT TYPE:  AMB   LOCATION:  ENDO                                 FACILITY:  [facility]   PHYSICIAN:  Petra Kuba, M.D.                 DATE OF BIRTH:  1946-11-09   DATE OF PROCEDURE:  08/02/2003  DATE OF DISCHARGE:                                 OPERATIVE REPORT   PROCEDURE:  Colonoscopy with polypectomy.   INDICATIONS FOR PROCEDURE:  A patient with a history of colon polyps due for  repeat screening.   Consent was signed after risks, benefits, methods, and options were  thoroughly discussed in the office in the past.   MEDICINES USED:  Demerol 80, Versed 8.   DESCRIPTION OF PROCEDURE:  Rectal inspection was pertinent for external  hemorrhoids, small. Digital exam was negative. The pediatric video  adjustable colonoscope was inserted, easily advanced around the colon to the  cecum. No obvious abnormality was seen on insertion. It did require some  abdominal pressure but no position changes. The cecum was identified by the  appendiceal orifice and the ileocecal valve. The prep was adequate. There  was some liquid stool that required washing and suctioning, the scope was  slowly withdrawn. In the proximal level of the splenic flexure, two small  polyps were seen, one was snared, electrocautery applied and polyp was  suctioned through the scope and collected in the trap. The other was  initially snared but only part was removed and we hot biopsied the remaining  part of the polyp. Both polyps were put in the same container after  recovery. No right sided abnormalities were seen. The scope was further  withdrawn, no remaining descending or sigmoid abnormalities were seen as we  slowly withdrew back to the rectum. Anorectal pull through and retroflexion  confirmed some small hemorrhoids. The scope was straightened, readvanced a  short ways up the left side of  the colon, air was suctioned, scope removed.  The patient tolerated the procedure well. There was no obvious or immediate  complications.   ENDOSCOPIC DIAGNOSIS:  1. Internal and external small hemorrhoids.  2. To the proximal level of the level, small polyps, one snared, the other     snared and hot biopsied.  3. Otherwise within normal limits to the cecum.   PLAN:  Happy to see back p.r.n., because of his rectal pressure will go  ahead and get a PSA. Await pathology but probably recheck colon screening in  five years otherwise return care to Dr. Jeannetta Nap for the customary health care  maintenance to include yearly rectals and guaiacs.  Petra Kuba, M.D.   MEM/MEDQ  D:  08/02/2003  T:  08/02/2003  Job:  [id number]   cc:   Windle Guard, M.D.  [address]  [place], [place] [address]  Fax: [phone]

## 2011-04-21 ENCOUNTER — Other Ambulatory Visit (HOSPITAL_COMMUNITY): Payer: Self-pay | Admitting: Family Medicine

## 2011-04-21 DIAGNOSIS — R1011 Right upper quadrant pain: Secondary | ICD-10-CM

## 2011-04-23 ENCOUNTER — Ambulatory Visit (HOSPITAL_COMMUNITY)
Admission: RE | Admit: 2011-04-23 | Discharge: 2011-04-23 | Disposition: A | Discharge status: Home / Self Care | Payer: 59 | Source: Ambulatory Visit | Attending: Family Medicine | Admitting: Family Medicine

## 2011-04-23 DIAGNOSIS — R10819 Abdominal tenderness, unspecified site: Secondary | ICD-10-CM | POA: Insufficient documentation

## 2011-04-23 DIAGNOSIS — R1011 Right upper quadrant pain: Secondary | ICD-10-CM | POA: Insufficient documentation

## 2011-05-14 ENCOUNTER — Other Ambulatory Visit (HOSPITAL_COMMUNITY): Payer: Self-pay | Admitting: Gastroenterology

## 2011-05-27 ENCOUNTER — Encounter (HOSPITAL_COMMUNITY)
Admission: RE | Admit: 2011-05-27 | Discharge: 2011-05-27 | Disposition: A | Discharge status: Home / Self Care | Payer: 59 | Source: Ambulatory Visit | Attending: Gastroenterology | Admitting: Gastroenterology

## 2011-05-27 DIAGNOSIS — R109 Unspecified abdominal pain: Secondary | ICD-10-CM | POA: Insufficient documentation

## 2011-05-27 MED ORDER — TECHNETIUM TC 99M MEBROFENIN IV KIT
5.5000 | PACK | Freq: Once | INTRAVENOUS | Status: AC | PRN
  Administered 2011-05-27: 6 via INTRAVENOUS

## 2011-05-28 ENCOUNTER — Other Ambulatory Visit (INDEPENDENT_AMBULATORY_CARE_PROVIDER_SITE_OTHER): Payer: 59 | Admitting: *Deleted

## 2011-05-28 DIAGNOSIS — E785 Hyperlipidemia, unspecified: Secondary | ICD-10-CM

## 2011-05-28 LAB — BASIC METABOLIC PANEL
BUN: 14 mg/dL (ref 6–23)
CO2: 27 mEq/L (ref 19–32)
Calcium: 9.2 mg/dL (ref 8.4–10.5)
Chloride: 103 mEq/L (ref 96–112)
Creatinine, Ser: 0.9 mg/dL (ref 0.4–1.5)
GFR: 87.06 mL/min (ref >60.00)
Glucose, Bld: 103 mg/dL — ABNORMAL HIGH (ref 70–99)
Potassium: 4.4 mEq/L (ref 3.5–5.1)
Sodium: 136 mEq/L (ref 135–145)

## 2011-05-28 LAB — HEPATIC FUNCTION PANEL
ALT: 25 U/L (ref 0–53)
AST: 24 U/L (ref 0–37)
Albumin: 4.2 g/dL (ref 3.5–5.2)
Alkaline Phosphatase: 97 U/L (ref 39–117)
Bilirubin, Direct: 0.1 mg/dL (ref 0.0–0.3)
Total Bilirubin: 0.9 mg/dL (ref 0.3–1.2)
Total Protein: 6.8 g/dL (ref 6.0–8.3)

## 2011-05-28 LAB — LIPID PANEL
Cholesterol: 168 mg/dL (ref 0–200)
HDL: 44.2 mg/dL (ref >39.00)
LDL Cholesterol: 109 mg/dL — ABNORMAL HIGH (ref 0–99)
Total CHOL/HDL Ratio: 4
Triglycerides: 75 mg/dL (ref 0.0–149.0)
VLDL: 15 mg/dL (ref 0.0–40.0)

## 2011-06-02 ENCOUNTER — Telehealth: Payer: Self-pay | Admitting: *Deleted

## 2011-06-02 NOTE — Telephone Encounter (Signed)
Message copied by Lorayne Bender on Tue Jun 02, 2011 10:54 AM ------      Message from: Rosalio Macadamia      Created: Fri May 29, 2011  8:00 AM       Ok to report. Labs are satisfactory.  Stay on same medicines. Recheck BMET/HPF/LIPIDS  in 6 months.

## 2011-06-02 NOTE — Telephone Encounter (Signed)
Notified of lab results. Will send to Dr. Aida Puffer.

## 2011-08-18 ENCOUNTER — Encounter: Payer: Self-pay | Admitting: Cardiology

## 2011-08-18 ENCOUNTER — Ambulatory Visit (INDEPENDENT_AMBULATORY_CARE_PROVIDER_SITE_OTHER): Payer: 59 | Admitting: Cardiology

## 2011-08-18 VITALS — BP 106/80 | HR 70 | Wt 202.0 lb

## 2011-08-18 DIAGNOSIS — E785 Hyperlipidemia, unspecified: Secondary | ICD-10-CM

## 2011-08-18 DIAGNOSIS — I2510 Atherosclerotic heart disease of native coronary artery without angina pectoris: Secondary | ICD-10-CM

## 2011-08-18 NOTE — Assessment & Plan Note (Signed)
The patient has not been experiencing any exertional chest pain or recurrent angina pectoris. 

## 2011-08-18 NOTE — Assessment & Plan Note (Signed)
The patient is on Lipitor 10 mg daily.  He is not having any side effects from the Lipitor.  We will plan to recheck fasting lab work in 6 months

## 2011-08-18 NOTE — Progress Notes (Signed)
Randy Wise Date of Birth:  1946/11/02 [facility] / [facility] [address]   [address] [place], [place]  [address] [phone]           Fax   [phone]  HPI: This pleasant 65 year old gentleman is seen for a scheduled followup office visit.  He has a past history of frequent PVCs.  He has a history of nonobstructive coronary disease.  He had cardiac catheterization in April 2012 which showed mild nonobstructive coronary disease with normal LV function.  Ejection fraction was 65%.  Patient has a history of mild hypercholesterolemia and is on low dose Lipitor 10 mg daily.  He's not having any side effects from the Lipitor  Current Outpatient Prescriptions  Medication Sig Dispense Refill  . aspirin 81 MG tablet Take 81 mg by mouth daily.        [name] atorvastatin (LIPITOR) 10 MG tablet Take 1 tablet (10 mg total) by mouth daily.  30 tablet  11  . fish oil-omega-3 fatty acids 1000 MG capsule Take by mouth 2 (two) times daily.        . nitroGLYCERIN (NITROSTAT) 0.4 MG SL tablet Place 0.4 mg under the tongue every 5 (five) minutes as needed.        [name] omeprazole (PRILOSEC OTC) 20 MG tablet Take 20 mg by mouth daily.          No Known Allergies  Patient Active Problem List  Diagnoses  . Chest pain  . HTN (hypertension)  . GERD (gastroesophageal reflux disease)  . Hyperlipidemia  . CAD (coronary artery disease)    History  Smoking status  . Never Smoker   Smokeless tobacco  . Never Used    History  Alcohol Use No    Family History  Problem Relation Age of Onset  . Hypertension Brother   . Heart disease Father   . Heart attack Father     Review of Systems: The patient denies any heat or cold intolerance.  No weight gain or weight loss.  The patient denies headaches or blurry vision.  There is no cough or sputum production.  The patient denies dizziness.  There is no hematuria or hematochezia.  The patient denies any muscle aches or arthritis.  The  patient denies any rash.  The patient denies frequent falling or instability.  There is no history of depression or anxiety.  All other systems were reviewed and are negative.   Physical Exam: Filed Vitals:   08/18/11 1738  BP: 106/80  Pulse: 70  The general appearance reveals a well-developed well-nourished gentleman in no distress.The head and neck exam reveals pupils equal and reactive.  Extraocular movements are full.  There is no scleral icterus.  The mouth and pharynx are normal.  The neck is supple.  The carotids reveal no bruits.  The jugular venous pressure is normal.  The  thyroid is not enlarged.  There is no lymphadenopathy.  The chest is clear to percussion and auscultation.  There are no rales or rhonchi.  Expansion of the chest is symmetrical.  The precordium is quiet.  The first heart sound is normal.  The second heart sound is physiologically split.  There is no murmur gallop rub or click.  There is no abnormal lift or heave.  The abdomen is soft and nontender.  The bowel sounds are normal.  The liver and spleen are not enlarged.  There are no abdominal masses.  There are no abdominal bruits.  Extremities  reveal good pedal pulses.  There is no phlebitis or edema.  There is no cyanosis or clubbing.  Strength is normal and symmetrical in all extremities.  There is no lateralizing weakness.  There are no sensory deficits.  The skin is warm and dry.  There is no rash.      Assessment / Plan:  Continue same medication.  Recheck 6 months for followup office visit and fasting lab work.

## 2011-08-31 LAB — DIFFERENTIAL
Basophils Absolute: 0
Basophils Absolute: 0
Basophils Relative: 1
Eosinophils Absolute: 0.2
Eosinophils Absolute: 0.4
Eosinophils Relative: 4
Eosinophils Relative: 8 — ABNORMAL HIGH
Lymphocytes Relative: 18
Monocytes Absolute: 0.6

## 2011-08-31 LAB — POCT I-STAT, CHEM 8
BUN: 11
Calcium, Ion: 1.22
Hemoglobin: 15
Sodium: 141
TCO2: 30

## 2011-08-31 LAB — CBC
HCT: 40.4
HCT: 44.3
Hemoglobin: 15
MCHC: 34
MCHC: 35.8
MCV: 89.7
MCV: 90.7
Platelets: 188
Platelets: 201
RDW: 13.3
RDW: 13.5

## 2011-08-31 LAB — CARDIAC PANEL(CRET KIN+CKTOT+MB+TROPI)
CK, MB: 1.9
Total CK: 105
Troponin I: <0.01

## 2011-08-31 LAB — BASIC METABOLIC PANEL
BUN: 9
Chloride: 109
Creatinine, Ser: 1.03
Glucose, Bld: 107 — ABNORMAL HIGH
Potassium: 4.2

## 2011-08-31 LAB — COMPREHENSIVE METABOLIC PANEL
ALT: 23
AST: 30
Albumin: 3.6
CO2: 25
Chloride: 109
Creatinine, Ser: 1.06
GFR calc Af Amer: >60
GFR calc non Af Amer: >60
Sodium: 141
Total Bilirubin: 0.7

## 2011-08-31 LAB — LIPID PANEL
Cholesterol: 172
LDL Cholesterol: 122 — ABNORMAL HIGH
Triglycerides: 79

## 2011-08-31 LAB — CK TOTAL AND CKMB (NOT AT ARMC)
CK, MB: 2.7
CK, MB: 2.7
Relative Index: 1.9
Total CK: 142
Total CK: 165

## 2011-08-31 LAB — POCT CARDIAC MARKERS
CKMB, poc: 2
Myoglobin, poc: 101

## 2011-08-31 LAB — HEMOGLOBIN A1C: Mean Plasma Glucose: 117

## 2011-11-10 ENCOUNTER — Telehealth: Payer: Self-pay | Admitting: Cardiology

## 2011-11-10 NOTE — Telephone Encounter (Signed)
Left message

## 2011-11-10 NOTE — Telephone Encounter (Signed)
The patient had a normal catheter on 03/30/2011.  He does not need another stress test just for benign PVCs which have been present chronically.  If he is having any symptoms of chest pain or shortness of breath then we should consider further testing.

## 2011-11-10 NOTE — Telephone Encounter (Addendum)
Pt saw dr little today had an ekg, showed pvc's , pt has them all the time so wasn't concerned, but dr little thinks a stress test is needed, pls advise [phone]

## 2011-11-10 NOTE — Telephone Encounter (Signed)
220-age and only needs to get to 85 % of that for work out per  Dr. Patty Sermons (patient wants to know max heart rate) Left message

## 2011-11-10 NOTE — Telephone Encounter (Signed)
Cath in April 2012, please advise

## 2011-11-16 ENCOUNTER — Telehealth: Payer: Self-pay | Admitting: Cardiology

## 2011-11-16 ENCOUNTER — Encounter: Payer: Self-pay | Admitting: Nurse Practitioner

## 2011-11-16 ENCOUNTER — Encounter (INDEPENDENT_AMBULATORY_CARE_PROVIDER_SITE_OTHER): Payer: 59

## 2011-11-16 ENCOUNTER — Ambulatory Visit (INDEPENDENT_AMBULATORY_CARE_PROVIDER_SITE_OTHER): Payer: 59 | Admitting: Nurse Practitioner

## 2011-11-16 VITALS — BP 128/72 | HR 96 | Ht 66.0 in | Wt 208.0 lb

## 2011-11-16 DIAGNOSIS — I493 Ventricular premature depolarization: Secondary | ICD-10-CM | POA: Insufficient documentation

## 2011-11-16 DIAGNOSIS — I2510 Atherosclerotic heart disease of native coronary artery without angina pectoris: Secondary | ICD-10-CM

## 2011-11-16 DIAGNOSIS — I4949 Other premature depolarization: Secondary | ICD-10-CM

## 2011-11-16 NOTE — Telephone Encounter (Signed)
New message:  Patient's wife called and stated that this patient does not feel well.  He is very tired, and pulse rate is very low.  Looks tired, just not himself.  Wondering if he could have some testing done possible monitor or to see doctor sooner.  Please call and advise.

## 2011-11-16 NOTE — Telephone Encounter (Signed)
Patient never called back, has appointment with Lawson Fiscal NP today

## 2011-11-16 NOTE — Telephone Encounter (Signed)
Patient coming today to see Lawson Fiscal NP

## 2011-11-16 NOTE — Patient Instructions (Addendum)
We are going to put a monitor on to look at your heart rhythm today. We will then have Dr. Patty Sermons review and see about medicines.

## 2011-11-16 NOTE — Assessment & Plan Note (Signed)
He had mild nonobstructive disease per cath in April with a normal EF.

## 2011-11-16 NOTE — Assessment & Plan Note (Signed)
He does have PVC's on his EKG with bigeminy. He is also reporting low heart rates at home. He wants to have a monitor placed. He is not interested in medicine just yet. We will place a 24 hour holter today. I will have Dr. Patty Sermons review. Patient is agreeable to this plan and will call if any problems develop in the interim.

## 2011-11-16 NOTE — Progress Notes (Signed)
Randy Wise Date of Birth: 01/31/1946 Medical Record #[id number]  History of Present Illness: Mr. Goulette is seen today for a work in visit. He is seen for Dr. Patty Sermons. His wife called earlier today to report that he was not feeling well. He has been very tired and noted to have a low pulse rate. She did not think he was himself. He has had cardiac cath earlier this year back in April. This showed mild nonobstructive CAD. EF is normal at 65%.   He comes in today. He notes that his heart is racing and that it also goes slow. When it is "slow", he is dizzy. He has seen rates down in the 30 to 40's. He knows he is having PVC's. This does make him lightheaded. He is not using caffeine and does not use alcohol. He does not have chest pain. He notes that he can exercise and he feels better.   Current Outpatient Prescriptions on File Prior to Visit  Medication Sig Dispense Refill  . aspirin 81 MG tablet Take 81 mg by mouth daily.        [name] atorvastatin (LIPITOR) 10 MG tablet Take 1 tablet (10 mg total) by mouth daily.  30 tablet  11  . fish oil-omega-3 fatty acids 1000 MG capsule Take by mouth 2 (two) times daily.        . nitroGLYCERIN (NITROSTAT) 0.4 MG SL tablet Place 0.4 mg under the tongue every 5 (five) minutes as needed.        [name] omeprazole (PRILOSEC OTC) 20 MG tablet Take 20 mg by mouth daily.          No Known Allergies  Past Medical History  Diagnosis Date  . Palpitations   . Prostatitis   . HTN (hypertension)   . GERD (gastroesophageal reflux disease)   . Hyperlipidemia   . CAD (coronary artery disease) 03/30/11    Mild nonobstructive disease per cath    Past Surgical History  Procedure Date  . Tonsillectomy   . Appendectomy   . Fistula removal   . Nuclear stress test 2009    Ef-63%, Normal  . US echocardiography 2009    Normal, EF-65%  . Cardiac catheterization April 2012    mild nonobstructive CAD with normal LV function. Left ventricular angiogram was  performed in the RAO projection and showed normal left ventricular systolic function with ejection fraction of 65%    History  Smoking status  . Never Smoker   Smokeless tobacco  . Never Used    History  Alcohol Use No    Family History  Problem Relation Age of Onset  . Hypertension Brother   . Heart disease Father   . Heart attack Father     Review of Systems: The review of systems is per the HPI.  All other systems were reviewed and are negative.  Physical Exam: BP 118/82  Pulse 96  Ht 5\' 6"  (1.676 m)  Wt 208 lb (94.348 kg)  BMI 33.57 kg/m2 Patient is very pleasant and in no acute distress. Skin is warm and dry. Color is normal.  HEENT is unremarkable. Normocephalic/atraumatic. PERRL. Sclera are nonicteric. Neck is supple. No masses. No JVD. Lungs are clear. Cardiac exam shows a regular rate and rhythm. He has frequent ectopics.  Abdomen is soft. Extremities are without edema. Gait and ROM are intact. No gross neurologic deficits noted.   LABORATORY DATA: EKG today shows sinus rhythm with bigeminy PVC's.   Assessment / Plan:

## 2011-11-18 ENCOUNTER — Telehealth: Payer: Self-pay | Admitting: *Deleted

## 2011-11-18 DIAGNOSIS — I493 Ventricular premature depolarization: Secondary | ICD-10-CM

## 2011-11-18 MED ORDER — METOPROLOL SUCCINATE ER 25 MG PO TB24: 25.0000 mg | ORAL_TABLET | Freq: Every day | ORAL | Status: DC

## 2011-11-18 NOTE — Telephone Encounter (Signed)
Advised patient of monitor results-frequent PVC's, couplets, and triplets.  Will Rx Toprol XL 25 mg daily per  Dr. Patty Sermons

## 2012-02-15 ENCOUNTER — Other Ambulatory Visit (INDEPENDENT_AMBULATORY_CARE_PROVIDER_SITE_OTHER): Payer: 59

## 2012-02-15 DIAGNOSIS — E785 Hyperlipidemia, unspecified: Secondary | ICD-10-CM

## 2012-02-15 LAB — HEPATIC FUNCTION PANEL
Bilirubin, Direct: 0 mg/dL (ref 0.0–0.3)
Total Protein: 6.7 g/dL (ref 6.0–8.3)

## 2012-02-15 LAB — LIPID PANEL
Cholesterol: 114 mg/dL (ref 0–200)
LDL Cholesterol: 53 mg/dL (ref 0–99)
Total CHOL/HDL Ratio: 3
VLDL: 17.8 mg/dL (ref 0.0–40.0)

## 2012-02-15 LAB — BASIC METABOLIC PANEL
BUN: 13 mg/dL (ref 6–23)
Chloride: 107 mEq/L (ref 96–112)
Potassium: 4.3 mEq/L (ref 3.5–5.1)

## 2012-02-15 NOTE — Progress Notes (Signed)
Quick Note:  Please make copy of labs for patient visit. ______ 

## 2012-02-22 ENCOUNTER — Encounter: Payer: Self-pay | Admitting: Cardiology

## 2012-02-22 ENCOUNTER — Ambulatory Visit (INDEPENDENT_AMBULATORY_CARE_PROVIDER_SITE_OTHER): Payer: 59 | Admitting: Cardiology

## 2012-02-22 DIAGNOSIS — E785 Hyperlipidemia, unspecified: Secondary | ICD-10-CM

## 2012-02-22 DIAGNOSIS — R079 Chest pain, unspecified: Secondary | ICD-10-CM

## 2012-02-22 NOTE — Progress Notes (Signed)
Randy Wise Date of Birth:  1946/08/02 [facility] [address] [place], [place]  [address] [phone]  Fax   [phone]  HPI: This pleasant 66 year old gentleman is seen for followup office visit.  He has a past history of frequent PVCs.  He has a strong family history of vascular disease.  He had a cardiac catheterization in April 2012 which showed mild nonobstructive coronary disease and his ejection fraction was normal at 65%.  He previously had had a problem with severe symptomatic PVCs.  However his history reveals that he was drinking 8 Dr Reino Kent drinks per day as well as caffeinated coffee.  Once he stopped drinking the caffeinated beverages his PVCs have resolved.  Also he has been on a low dose of metoprolol.  The patient also has a history of dyslipidemia and is on Lipitor 10 mg daily  Current Outpatient Prescriptions  Medication Sig Dispense Refill  . aspirin 81 MG tablet Take 81 mg by mouth daily.        [name] atorvastatin (LIPITOR) 10 MG tablet Take 1 tablet (10 mg total) by mouth daily.  30 tablet  11  . fish oil-omega-3 fatty acids 1000 MG capsule Take by mouth 2 (two) times daily.        . metoprolol succinate (TOPROL XL) 25 MG 24 hr tablet Take 1 tablet (25 mg total) by mouth daily.  30 tablet  11  . nitroGLYCERIN (NITROSTAT) 0.4 MG SL tablet Place 0.4 mg under the tongue every 5 (five) minutes as needed.        [name] omeprazole (PRILOSEC OTC) 20 MG tablet Take 20 mg by mouth daily.          No Known Allergies  Patient Active Problem List  Diagnoses  . Chest pain  . HTN (hypertension)  . GERD (gastroesophageal reflux disease)  . Hyperlipidemia  . CAD (coronary artery disease)  . Symptomatic PVCs    History  Smoking status  . Never Smoker   Smokeless tobacco  . Never Used    History  Alcohol Use No    Family History  Problem Relation Age of Onset  . Hypertension Brother   . Heart disease Father   . Heart attack Father      Review of Systems: The patient denies any heat or cold intolerance.  No weight gain or weight loss.  The patient denies headaches or blurry vision.  There is no cough or sputum production.  The patient denies dizziness.  There is no hematuria or hematochezia.  The patient denies any muscle aches or arthritis.  The patient denies any rash.  The patient denies frequent falling or instability.  There is no history of depression or anxiety.  All other systems were reviewed and are negative.   Physical Exam: Filed Vitals:   02/22/12 1426  BP: 120/78  Pulse: 60   the general appearance reveals a well-developed well-nourished gentleman in no distress.Pupils equal and reactive.   Extraocular Movements are full.  There is no scleral icterus.  The mouth and pharynx are normal.  The neck is supple.  The carotids reveal no bruits.  The jugular venous pressure is normal.  The thyroid is not enlarged.  There is no lymphadenopathy.  The chest is clear to percussion and auscultation. There are no rales or rhonchi. Expansion of the chest is symmetrical.  The precordium is quiet.  The first heart sound is normal.  The second heart sound is physiologically split.  There  is no murmur gallop rub or click.  There is no abnormal lift or heave.  The abdomen is soft and nontender. Bowel sounds are normal. The liver and spleen are not enlarged. There Are no abdominal masses. There are no bruits.  The pedal pulses are good.  There is no phlebitis or edema.  There is no cyanosis or clubbing. Strength is normal and symmetrical in all extremities.  There is no lateralizing weakness.  There are no sensory deficits.  Integument reveals some poison ivy on his right arm    Assessment / Plan:  Continue same medication.  He will be getting some fasting lab work in 6 months at Dr. Fredirick Maudlin office and we will plan to see him back here in one year for followup office visit and fasting lipid panel hepatic function panel and  basal metabolic panel.  Continue to avoid caffeine.  He has not been experiencing any dyspepsia and he will try to taper back on his omeprazole.  In retrospect he thinks that the Dr Pat Kocher were probably causing his dyspepsia.

## 2012-02-22 NOTE — Assessment & Plan Note (Signed)
The patient exercises at the gym 3 days a week.  Tonight he is going to start working out with a trainer and exercising more vigorously.  He has not been experiencing any chest pain or angina

## 2012-02-22 NOTE — Patient Instructions (Signed)
Your physician wants you to follow-up in: 1 year  You will receive a reminder letter in the mail two months in advance. If you don't receive a letter, please call our office to schedule the follow-up appointment.  Your physician recommends that you continue on your current medications as directed. Please refer to the Current Medication list given to you today.  

## 2012-02-22 NOTE — Assessment & Plan Note (Signed)
The patient has not had any side effects from the low-dose Lipitor

## 2012-10-19 ENCOUNTER — Telehealth: Payer: Self-pay | Admitting: Cardiology

## 2012-10-19 DIAGNOSIS — I493 Ventricular premature depolarization: Secondary | ICD-10-CM

## 2012-10-19 MED ORDER — METOPROLOL SUCCINATE ER 25 MG PO TB24: 25.0000 mg | ORAL_TABLET | Freq: Every day | ORAL | Status: AC

## 2012-10-19 NOTE — Telephone Encounter (Signed)
Pt's wife calling re metoprolol , 10-30-12 pt goes on medicare, he will not medication insurance until January, he is out now and needs refill, wife requesting 60 day supply to last until insurance kicks in, uses [facility] [phone]

## 2012-10-19 NOTE — Telephone Encounter (Signed)
rx sent into pharmacy for a 90 day supply. Pt wife notified.

## 2012-11-03 DIAGNOSIS — Z23 Encounter for immunization: Secondary | ICD-10-CM | POA: Diagnosis not present

## 2012-12-15 DIAGNOSIS — K5730 Diverticulosis of large intestine without perforation or abscess without bleeding: Secondary | ICD-10-CM | POA: Diagnosis not present

## 2012-12-15 DIAGNOSIS — Z86010 Personal history of colonic polyps: Secondary | ICD-10-CM | POA: Diagnosis not present

## 2012-12-15 DIAGNOSIS — Z09 Encounter for follow-up examination after completed treatment for conditions other than malignant neoplasm: Secondary | ICD-10-CM | POA: Diagnosis not present

## 2013-05-11 DIAGNOSIS — W57XXXA Bitten or stung by nonvenomous insect and other nonvenomous arthropods, initial encounter: Secondary | ICD-10-CM | POA: Diagnosis not present

## 2013-05-29 DIAGNOSIS — W57XXXA Bitten or stung by nonvenomous insect and other nonvenomous arthropods, initial encounter: Secondary | ICD-10-CM | POA: Diagnosis not present

## 2013-05-29 DIAGNOSIS — Z1331 Encounter for screening for depression: Secondary | ICD-10-CM | POA: Diagnosis not present

## 2013-09-11 DIAGNOSIS — E785 Hyperlipidemia, unspecified: Secondary | ICD-10-CM | POA: Diagnosis not present

## 2013-09-11 DIAGNOSIS — N400 Enlarged prostate without lower urinary tract symptoms: Secondary | ICD-10-CM | POA: Diagnosis not present

## 2013-09-11 DIAGNOSIS — Z1331 Encounter for screening for depression: Secondary | ICD-10-CM | POA: Diagnosis not present

## 2013-09-11 DIAGNOSIS — K219 Gastro-esophageal reflux disease without esophagitis: Secondary | ICD-10-CM | POA: Diagnosis not present

## 2013-09-11 DIAGNOSIS — Z23 Encounter for immunization: Secondary | ICD-10-CM | POA: Diagnosis not present

## 2013-09-11 DIAGNOSIS — Z0000 Encounter for general adult medical examination without abnormal findings: Secondary | ICD-10-CM | POA: Diagnosis not present

## 2013-09-21 DIAGNOSIS — Z23 Encounter for immunization: Secondary | ICD-10-CM | POA: Diagnosis not present

## 2013-12-06 ENCOUNTER — Encounter: Payer: Self-pay | Admitting: Cardiology

## 2014-01-04 DIAGNOSIS — J069 Acute upper respiratory infection, unspecified: Secondary | ICD-10-CM | POA: Diagnosis not present

## 2014-01-22 ENCOUNTER — Telehealth: Payer: Self-pay | Admitting: Cardiology

## 2014-01-22 NOTE — Telephone Encounter (Signed)
New message     Patient c/o dizziness and night sweats.  Could this be from his pvc's he has been having for years

## 2014-01-22 NOTE — Telephone Encounter (Signed)
Patient has had a change in his PVC's they are now going beat, beat, thump, beat, beat, thump. Patient does not have any issues during exercise (does zumba and kickboxing).  Patient just wanted for [place] to be aware. States he does wake up sometimes and will have to get up and do jumping jacks to get back to beating right. He did not want to schedule an appointment at this time but will call back if they continue and he goes to his PCP and they feel he needs to be seen. Patient is concerned if he does not handle it this way his insurance will not cover everything. Patient stated multiple times he felt sure it was PVC's, he just wanted for [place] to be aware

## 2014-03-13 DIAGNOSIS — IMO0001 Reserved for inherently not codable concepts without codable children: Secondary | ICD-10-CM | POA: Diagnosis not present

## 2014-06-15 DIAGNOSIS — K649 Unspecified hemorrhoids: Secondary | ICD-10-CM | POA: Diagnosis not present

## 2014-06-15 DIAGNOSIS — M533 Sacrococcygeal disorders, not elsewhere classified: Secondary | ICD-10-CM | POA: Diagnosis not present

## 2014-06-15 DIAGNOSIS — Z87448 Personal history of other diseases of urinary system: Secondary | ICD-10-CM | POA: Diagnosis not present

## 2014-07-11 DIAGNOSIS — L570 Actinic keratosis: Secondary | ICD-10-CM | POA: Diagnosis not present

## 2014-07-11 DIAGNOSIS — D485 Neoplasm of uncertain behavior of skin: Secondary | ICD-10-CM | POA: Diagnosis not present

## 2014-08-27 DIAGNOSIS — H43819 Vitreous degeneration, unspecified eye: Secondary | ICD-10-CM | POA: Diagnosis not present

## 2014-08-27 DIAGNOSIS — H40039 Anatomical narrow angle, unspecified eye: Secondary | ICD-10-CM | POA: Diagnosis not present

## 2014-09-13 DIAGNOSIS — Z23 Encounter for immunization: Secondary | ICD-10-CM | POA: Diagnosis not present

## 2014-09-13 DIAGNOSIS — N400 Enlarged prostate without lower urinary tract symptoms: Secondary | ICD-10-CM | POA: Diagnosis not present

## 2014-09-13 DIAGNOSIS — Z0001 Encounter for general adult medical examination with abnormal findings: Secondary | ICD-10-CM | POA: Diagnosis not present

## 2014-09-13 DIAGNOSIS — E785 Hyperlipidemia, unspecified: Secondary | ICD-10-CM | POA: Diagnosis not present

## 2014-09-13 DIAGNOSIS — Z1389 Encounter for screening for other disorder: Secondary | ICD-10-CM | POA: Diagnosis not present

## 2014-09-13 DIAGNOSIS — K219 Gastro-esophageal reflux disease without esophagitis: Secondary | ICD-10-CM | POA: Diagnosis not present

## 2015-04-22 DIAGNOSIS — L720 Epidermal cyst: Secondary | ICD-10-CM | POA: Diagnosis not present

## 2015-09-16 DIAGNOSIS — K219 Gastro-esophageal reflux disease without esophagitis: Secondary | ICD-10-CM | POA: Diagnosis not present

## 2015-09-16 DIAGNOSIS — N402 Nodular prostate without lower urinary tract symptoms: Secondary | ICD-10-CM | POA: Diagnosis not present

## 2015-09-16 DIAGNOSIS — Z0000 Encounter for general adult medical examination without abnormal findings: Secondary | ICD-10-CM | POA: Diagnosis not present

## 2015-09-16 DIAGNOSIS — Z1389 Encounter for screening for other disorder: Secondary | ICD-10-CM | POA: Diagnosis not present

## 2015-09-16 DIAGNOSIS — E785 Hyperlipidemia, unspecified: Secondary | ICD-10-CM | POA: Diagnosis not present

## 2015-09-16 DIAGNOSIS — I493 Ventricular premature depolarization: Secondary | ICD-10-CM | POA: Diagnosis not present

## 2016-03-27 DIAGNOSIS — N50819 Testicular pain, unspecified: Secondary | ICD-10-CM | POA: Diagnosis not present

## 2016-03-27 DIAGNOSIS — N410 Acute prostatitis: Secondary | ICD-10-CM | POA: Diagnosis not present

## 2016-04-03 DIAGNOSIS — N410 Acute prostatitis: Secondary | ICD-10-CM | POA: Diagnosis not present

## 2016-05-18 DIAGNOSIS — M7521 Bicipital tendinitis, right shoulder: Secondary | ICD-10-CM | POA: Diagnosis not present

## 2016-06-04 DIAGNOSIS — I493 Ventricular premature depolarization: Secondary | ICD-10-CM | POA: Diagnosis not present

## 2016-06-04 DIAGNOSIS — I499 Cardiac arrhythmia, unspecified: Secondary | ICD-10-CM | POA: Diagnosis not present

## 2016-08-10 DIAGNOSIS — L821 Other seborrheic keratosis: Secondary | ICD-10-CM | POA: Diagnosis not present

## 2016-08-10 DIAGNOSIS — C44311 Basal cell carcinoma of skin of nose: Secondary | ICD-10-CM | POA: Diagnosis not present

## 2016-08-10 DIAGNOSIS — L820 Inflamed seborrheic keratosis: Secondary | ICD-10-CM | POA: Diagnosis not present

## 2016-08-10 DIAGNOSIS — L814 Other melanin hyperpigmentation: Secondary | ICD-10-CM | POA: Diagnosis not present

## 2016-08-10 DIAGNOSIS — D2371 Other benign neoplasm of skin of right lower limb, including hip: Secondary | ICD-10-CM | POA: Diagnosis not present

## 2016-08-10 DIAGNOSIS — D1801 Hemangioma of skin and subcutaneous tissue: Secondary | ICD-10-CM | POA: Diagnosis not present

## 2016-09-16 DIAGNOSIS — K219 Gastro-esophageal reflux disease without esophagitis: Secondary | ICD-10-CM | POA: Diagnosis not present

## 2016-09-16 DIAGNOSIS — Z23 Encounter for immunization: Secondary | ICD-10-CM | POA: Diagnosis not present

## 2016-09-16 DIAGNOSIS — N400 Enlarged prostate without lower urinary tract symptoms: Secondary | ICD-10-CM | POA: Diagnosis not present

## 2016-09-16 DIAGNOSIS — Z1389 Encounter for screening for other disorder: Secondary | ICD-10-CM | POA: Diagnosis not present

## 2016-09-16 DIAGNOSIS — E785 Hyperlipidemia, unspecified: Secondary | ICD-10-CM | POA: Diagnosis not present

## 2016-09-16 DIAGNOSIS — I493 Ventricular premature depolarization: Secondary | ICD-10-CM | POA: Diagnosis not present

## 2016-09-16 DIAGNOSIS — Z0000 Encounter for general adult medical examination without abnormal findings: Secondary | ICD-10-CM | POA: Diagnosis not present

## 2016-09-16 DIAGNOSIS — Z1159 Encounter for screening for other viral diseases: Secondary | ICD-10-CM | POA: Diagnosis not present

## 2016-10-14 DIAGNOSIS — C44311 Basal cell carcinoma of skin of nose: Secondary | ICD-10-CM | POA: Diagnosis not present

## 2017-02-12 DIAGNOSIS — R05 Cough: Secondary | ICD-10-CM | POA: Diagnosis not present

## 2017-02-12 DIAGNOSIS — J018 Other acute sinusitis: Secondary | ICD-10-CM | POA: Diagnosis not present

## 2017-07-31 DIAGNOSIS — H10013 Acute follicular conjunctivitis, bilateral: Secondary | ICD-10-CM | POA: Diagnosis not present

## 2017-07-31 DIAGNOSIS — S0502XA Injury of conjunctiva and corneal abrasion without foreign body, left eye, initial encounter: Secondary | ICD-10-CM | POA: Diagnosis not present

## 2017-08-02 DIAGNOSIS — H10013 Acute follicular conjunctivitis, bilateral: Secondary | ICD-10-CM | POA: Diagnosis not present

## 2017-08-09 DIAGNOSIS — H00021 Hordeolum internum right upper eyelid: Secondary | ICD-10-CM | POA: Diagnosis not present

## 2017-08-12 DIAGNOSIS — B308 Other viral conjunctivitis: Secondary | ICD-10-CM | POA: Diagnosis not present

## 2017-08-12 DIAGNOSIS — S0012XA Contusion of left eyelid and periocular area, initial encounter: Secondary | ICD-10-CM | POA: Diagnosis not present

## 2017-08-18 DIAGNOSIS — H10013 Acute follicular conjunctivitis, bilateral: Secondary | ICD-10-CM | POA: Diagnosis not present

## 2017-08-23 DIAGNOSIS — S0012XA Contusion of left eyelid and periocular area, initial encounter: Secondary | ICD-10-CM | POA: Diagnosis not present

## 2017-08-23 DIAGNOSIS — B308 Other viral conjunctivitis: Secondary | ICD-10-CM | POA: Diagnosis not present

## 2017-09-01 DIAGNOSIS — S0012XA Contusion of left eyelid and periocular area, initial encounter: Secondary | ICD-10-CM | POA: Diagnosis not present

## 2017-09-01 DIAGNOSIS — B308 Other viral conjunctivitis: Secondary | ICD-10-CM | POA: Diagnosis not present

## 2017-09-10 DIAGNOSIS — L570 Actinic keratosis: Secondary | ICD-10-CM | POA: Diagnosis not present

## 2017-09-10 DIAGNOSIS — L821 Other seborrheic keratosis: Secondary | ICD-10-CM | POA: Diagnosis not present

## 2017-09-17 DIAGNOSIS — N400 Enlarged prostate without lower urinary tract symptoms: Secondary | ICD-10-CM | POA: Diagnosis not present

## 2017-09-17 DIAGNOSIS — Z23 Encounter for immunization: Secondary | ICD-10-CM | POA: Diagnosis not present

## 2017-09-17 DIAGNOSIS — E785 Hyperlipidemia, unspecified: Secondary | ICD-10-CM | POA: Diagnosis not present

## 2017-09-17 DIAGNOSIS — Z0000 Encounter for general adult medical examination without abnormal findings: Secondary | ICD-10-CM | POA: Diagnosis not present

## 2017-09-17 DIAGNOSIS — I493 Ventricular premature depolarization: Secondary | ICD-10-CM | POA: Diagnosis not present

## 2017-09-17 DIAGNOSIS — K219 Gastro-esophageal reflux disease without esophagitis: Secondary | ICD-10-CM | POA: Diagnosis not present

## 2017-09-17 DIAGNOSIS — Z1389 Encounter for screening for other disorder: Secondary | ICD-10-CM | POA: Diagnosis not present

## 2017-11-01 DIAGNOSIS — B308 Other viral conjunctivitis: Secondary | ICD-10-CM | POA: Diagnosis not present

## 2017-11-01 DIAGNOSIS — H16143 Punctate keratitis, bilateral: Secondary | ICD-10-CM | POA: Diagnosis not present

## 2017-11-09 DIAGNOSIS — H16143 Punctate keratitis, bilateral: Secondary | ICD-10-CM | POA: Diagnosis not present

## 2017-11-09 DIAGNOSIS — H16223 Keratoconjunctivitis sicca, not specified as Sjogren's, bilateral: Secondary | ICD-10-CM | POA: Diagnosis not present

## 2017-11-09 DIAGNOSIS — B308 Other viral conjunctivitis: Secondary | ICD-10-CM | POA: Diagnosis not present

## 2017-12-03 DIAGNOSIS — B308 Other viral conjunctivitis: Secondary | ICD-10-CM | POA: Diagnosis not present

## 2017-12-03 DIAGNOSIS — H16223 Keratoconjunctivitis sicca, not specified as Sjogren's, bilateral: Secondary | ICD-10-CM | POA: Diagnosis not present

## 2017-12-03 DIAGNOSIS — H16143 Punctate keratitis, bilateral: Secondary | ICD-10-CM | POA: Diagnosis not present

## 2017-12-09 DIAGNOSIS — D126 Benign neoplasm of colon, unspecified: Secondary | ICD-10-CM | POA: Diagnosis not present

## 2017-12-09 DIAGNOSIS — K635 Polyp of colon: Secondary | ICD-10-CM | POA: Diagnosis not present

## 2017-12-09 DIAGNOSIS — Z86010 Personal history of colonic polyps: Secondary | ICD-10-CM | POA: Diagnosis not present

## 2017-12-09 DIAGNOSIS — K5730 Diverticulosis of large intestine without perforation or abscess without bleeding: Secondary | ICD-10-CM | POA: Diagnosis not present

## 2017-12-14 DIAGNOSIS — K635 Polyp of colon: Secondary | ICD-10-CM | POA: Diagnosis not present

## 2017-12-14 DIAGNOSIS — D126 Benign neoplasm of colon, unspecified: Secondary | ICD-10-CM | POA: Diagnosis not present

## 2017-12-24 DIAGNOSIS — B308 Other viral conjunctivitis: Secondary | ICD-10-CM | POA: Diagnosis not present

## 2017-12-24 DIAGNOSIS — H16223 Keratoconjunctivitis sicca, not specified as Sjogren's, bilateral: Secondary | ICD-10-CM | POA: Diagnosis not present

## 2017-12-24 DIAGNOSIS — H16143 Punctate keratitis, bilateral: Secondary | ICD-10-CM | POA: Diagnosis not present

## 2018-08-24 DIAGNOSIS — H16143 Punctate keratitis, bilateral: Secondary | ICD-10-CM | POA: Diagnosis not present

## 2018-08-24 DIAGNOSIS — H16223 Keratoconjunctivitis sicca, not specified as Sjogren's, bilateral: Secondary | ICD-10-CM | POA: Diagnosis not present

## 2018-08-24 DIAGNOSIS — B308 Other viral conjunctivitis: Secondary | ICD-10-CM | POA: Diagnosis not present

## 2018-09-24 DIAGNOSIS — Z23 Encounter for immunization: Secondary | ICD-10-CM | POA: Diagnosis not present

## 2018-10-18 DIAGNOSIS — Z1389 Encounter for screening for other disorder: Secondary | ICD-10-CM | POA: Diagnosis not present

## 2018-10-18 DIAGNOSIS — N400 Enlarged prostate without lower urinary tract symptoms: Secondary | ICD-10-CM | POA: Diagnosis not present

## 2018-10-18 DIAGNOSIS — Z0000 Encounter for general adult medical examination without abnormal findings: Secondary | ICD-10-CM | POA: Diagnosis not present

## 2018-10-18 DIAGNOSIS — K219 Gastro-esophageal reflux disease without esophagitis: Secondary | ICD-10-CM | POA: Diagnosis not present

## 2018-10-18 DIAGNOSIS — E7800 Pure hypercholesterolemia, unspecified: Secondary | ICD-10-CM | POA: Diagnosis not present

## 2018-12-16 DIAGNOSIS — H6123 Impacted cerumen, bilateral: Secondary | ICD-10-CM | POA: Diagnosis not present

## 2019-08-28 DIAGNOSIS — H16223 Keratoconjunctivitis sicca, not specified as Sjogren's, bilateral: Secondary | ICD-10-CM | POA: Diagnosis not present

## 2019-08-28 DIAGNOSIS — H43813 Vitreous degeneration, bilateral: Secondary | ICD-10-CM | POA: Diagnosis not present

## 2019-08-28 DIAGNOSIS — H2513 Age-related nuclear cataract, bilateral: Secondary | ICD-10-CM | POA: Diagnosis not present

## 2019-08-28 DIAGNOSIS — B308 Other viral conjunctivitis: Secondary | ICD-10-CM | POA: Diagnosis not present

## 2019-08-28 DIAGNOSIS — H16143 Punctate keratitis, bilateral: Secondary | ICD-10-CM | POA: Diagnosis not present

## 2019-09-17 DIAGNOSIS — Z23 Encounter for immunization: Secondary | ICD-10-CM | POA: Diagnosis not present

## 2019-10-19 DIAGNOSIS — R070 Pain in throat: Secondary | ICD-10-CM | POA: Diagnosis not present

## 2019-10-19 DIAGNOSIS — J029 Acute pharyngitis, unspecified: Secondary | ICD-10-CM | POA: Diagnosis not present

## 2019-10-19 DIAGNOSIS — R079 Chest pain, unspecified: Secondary | ICD-10-CM | POA: Diagnosis not present

## 2019-10-19 DIAGNOSIS — Z20828 Contact with and (suspected) exposure to other viral communicable diseases: Secondary | ICD-10-CM | POA: Diagnosis not present

## 2019-10-31 DIAGNOSIS — N400 Enlarged prostate without lower urinary tract symptoms: Secondary | ICD-10-CM | POA: Diagnosis not present

## 2019-10-31 DIAGNOSIS — Z1389 Encounter for screening for other disorder: Secondary | ICD-10-CM | POA: Diagnosis not present

## 2019-10-31 DIAGNOSIS — K219 Gastro-esophageal reflux disease without esophagitis: Secondary | ICD-10-CM | POA: Diagnosis not present

## 2019-10-31 DIAGNOSIS — E7800 Pure hypercholesterolemia, unspecified: Secondary | ICD-10-CM | POA: Diagnosis not present

## 2019-10-31 DIAGNOSIS — Z0000 Encounter for general adult medical examination without abnormal findings: Secondary | ICD-10-CM | POA: Diagnosis not present

## 2019-12-23 ENCOUNTER — Ambulatory Visit: Payer: Medicare Other | Attending: Internal Medicine

## 2019-12-23 DIAGNOSIS — Z23 Encounter for immunization: Secondary | ICD-10-CM | POA: Insufficient documentation

## 2019-12-23 NOTE — Progress Notes (Signed)
   Covid-19 Vaccination Clinic  Name:  Randy Wise    MRN: [id number] DOB: 1946/03/18  12/23/2019  [name] was observed post Covid-19 immunization for 15 minutes without incidence. He was provided with Vaccine Information Sheet and instruction to access the V-Safe system.   [name] was instructed to call 911 with any severe reactions post vaccine: [name] Difficulty breathing  . Swelling of your face and throat  . A fast heartbeat  . A bad rash all over your body  . Dizziness and weakness    Immunizations Administered    Name Date Dose VIS Date Route   Pfizer COVID-19 Vaccine 12/23/2019  1:02 PM 0.3 mL 11/10/2019 Intramuscular   Manufacturer: [place]   Lot: [id number]   [place]: [id number]

## 2020-01-13 ENCOUNTER — Ambulatory Visit: Payer: Medicare Other | Attending: Internal Medicine

## 2020-01-13 DIAGNOSIS — Z23 Encounter for immunization: Secondary | ICD-10-CM | POA: Insufficient documentation

## 2020-01-13 NOTE — Progress Notes (Signed)
   Covid-19 Vaccination Clinic  Name:  Randy Wise    MRN: [id number] DOB: January 25, 1946  01/13/2020  [name] was observed post Covid-19 immunization for 15 minutes without incidence. He was provided with Vaccine Information Sheet and instruction to access the V-Safe system.   [name] was instructed to call 911 with any severe reactions post vaccine: [name] Difficulty breathing  . Swelling of your face and throat  . A fast heartbeat  . A bad rash all over your body  . Dizziness and weakness    Immunizations Administered    Name Date Dose VIS Date Route   Pfizer COVID-19 Vaccine 01/13/2020 11:43 AM 0.3 mL 11/10/2019 Intramuscular   Manufacturer: [place]   Lot: [id number]   [place]: [id number]

## 2020-07-30 DIAGNOSIS — M62838 Other muscle spasm: Secondary | ICD-10-CM | POA: Diagnosis not present

## 2020-07-30 DIAGNOSIS — K219 Gastro-esophageal reflux disease without esophagitis: Secondary | ICD-10-CM | POA: Diagnosis not present

## 2020-08-01 DIAGNOSIS — M25511 Pain in right shoulder: Secondary | ICD-10-CM | POA: Diagnosis not present

## 2020-08-01 DIAGNOSIS — M542 Cervicalgia: Secondary | ICD-10-CM | POA: Diagnosis not present

## 2020-08-05 DIAGNOSIS — J209 Acute bronchitis, unspecified: Secondary | ICD-10-CM | POA: Diagnosis not present

## 2020-08-05 DIAGNOSIS — Z03818 Encounter for observation for suspected exposure to other biological agents ruled out: Secondary | ICD-10-CM | POA: Diagnosis not present

## 2020-08-05 DIAGNOSIS — R05 Cough: Secondary | ICD-10-CM | POA: Diagnosis not present

## 2020-08-05 DIAGNOSIS — B974 Respiratory syncytial virus as the cause of diseases classified elsewhere: Secondary | ICD-10-CM | POA: Diagnosis not present

## 2020-08-05 DIAGNOSIS — J205 Acute bronchitis due to respiratory syncytial virus: Secondary | ICD-10-CM | POA: Diagnosis not present

## 2020-08-16 DIAGNOSIS — M25511 Pain in right shoulder: Secondary | ICD-10-CM | POA: Diagnosis not present

## 2020-08-16 DIAGNOSIS — M7541 Impingement syndrome of right shoulder: Secondary | ICD-10-CM | POA: Diagnosis not present

## 2020-08-28 DIAGNOSIS — H43813 Vitreous degeneration, bilateral: Secondary | ICD-10-CM | POA: Diagnosis not present

## 2020-08-28 DIAGNOSIS — H16223 Keratoconjunctivitis sicca, not specified as Sjogren's, bilateral: Secondary | ICD-10-CM | POA: Diagnosis not present

## 2020-08-28 DIAGNOSIS — H2513 Age-related nuclear cataract, bilateral: Secondary | ICD-10-CM | POA: Diagnosis not present

## 2020-08-28 DIAGNOSIS — H16143 Punctate keratitis, bilateral: Secondary | ICD-10-CM | POA: Diagnosis not present

## 2020-10-09 DIAGNOSIS — Z23 Encounter for immunization: Secondary | ICD-10-CM | POA: Diagnosis not present

## 2020-10-19 DIAGNOSIS — Z23 Encounter for immunization: Secondary | ICD-10-CM | POA: Diagnosis not present

## 2020-11-04 DIAGNOSIS — K219 Gastro-esophageal reflux disease without esophagitis: Secondary | ICD-10-CM | POA: Diagnosis not present

## 2020-11-04 DIAGNOSIS — Z0000 Encounter for general adult medical examination without abnormal findings: Secondary | ICD-10-CM | POA: Diagnosis not present

## 2020-11-04 DIAGNOSIS — Z1389 Encounter for screening for other disorder: Secondary | ICD-10-CM | POA: Diagnosis not present

## 2020-11-04 DIAGNOSIS — E7800 Pure hypercholesterolemia, unspecified: Secondary | ICD-10-CM | POA: Diagnosis not present

## 2020-11-04 DIAGNOSIS — N400 Enlarged prostate without lower urinary tract symptoms: Secondary | ICD-10-CM | POA: Diagnosis not present

## 2020-11-12 DIAGNOSIS — L814 Other melanin hyperpigmentation: Secondary | ICD-10-CM | POA: Diagnosis not present

## 2020-11-12 DIAGNOSIS — L918 Other hypertrophic disorders of the skin: Secondary | ICD-10-CM | POA: Diagnosis not present

## 2020-11-12 DIAGNOSIS — Z85828 Personal history of other malignant neoplasm of skin: Secondary | ICD-10-CM | POA: Diagnosis not present

## 2020-11-12 DIAGNOSIS — L570 Actinic keratosis: Secondary | ICD-10-CM | POA: Diagnosis not present

## 2020-11-12 DIAGNOSIS — L821 Other seborrheic keratosis: Secondary | ICD-10-CM | POA: Diagnosis not present

## 2020-11-12 DIAGNOSIS — D229 Melanocytic nevi, unspecified: Secondary | ICD-10-CM | POA: Diagnosis not present

## 2020-11-12 DIAGNOSIS — L905 Scar conditions and fibrosis of skin: Secondary | ICD-10-CM | POA: Diagnosis not present

## 2021-01-26 DIAGNOSIS — R059 Cough, unspecified: Secondary | ICD-10-CM | POA: Diagnosis not present

## 2021-01-26 DIAGNOSIS — J40 Bronchitis, not specified as acute or chronic: Secondary | ICD-10-CM | POA: Diagnosis not present

## 2021-01-26 DIAGNOSIS — R509 Fever, unspecified: Secondary | ICD-10-CM | POA: Diagnosis not present

## 2021-01-26 DIAGNOSIS — R0981 Nasal congestion: Secondary | ICD-10-CM | POA: Diagnosis not present

## 2021-01-26 DIAGNOSIS — J329 Chronic sinusitis, unspecified: Secondary | ICD-10-CM | POA: Diagnosis not present

## 2021-01-29 ENCOUNTER — Other Ambulatory Visit: Payer: Self-pay | Admitting: Adult Health

## 2021-01-29 DIAGNOSIS — U071 COVID-19: Secondary | ICD-10-CM

## 2021-01-29 NOTE — Progress Notes (Signed)
I connected by phone with Randy Wise on 01/29/2021 at 12:49 PM to discuss the potential use of a new treatment for mild to moderate COVID-19 viral infection in non-hospitalized patients.  This patient is a 75 y.o. male that meets the FDA criteria for Emergency Use Authorization of COVID monoclonal antibody sotrovimab.  Has a (+) direct SARS-CoV-2 viral test result  Has mild or moderate COVID-19   Is NOT hospitalized due to COVID-19  Is within 10 days of symptom onset  Has at least one of the high risk factor(s) for progression to severe COVID-19 and/or hospitalization as defined in EUA.  Specific high risk criteria : Older age (>/= 74 yo)   I have spoken and communicated the following to the patient or parent/caregiver regarding COVID monoclonal antibody treatment:  1. FDA has authorized the emergency use for the treatment of mild to moderate COVID-19 in adults and pediatric patients with positive results of direct SARS-CoV-2 viral testing who are 62 years of age and older weighing at least 40 kg, and who are at high risk for progressing to severe COVID-19 and/or hospitalization.  2. The significant known and potential risks and benefits of COVID monoclonal antibody, and the extent to which such potential risks and benefits are unknown.  3. Information on available alternative treatments and the risks and benefits of those alternatives, including clinical trials.  4. Patients treated with COVID monoclonal antibody should continue to self-isolate and use infection control measures (e.g., wear mask, isolate, social distance, avoid sharing personal items, clean and disinfect "high touch" surfaces, and frequent handwashing) according to CDC guidelines.   5. The patient or parent/caregiver has the option to accept or refuse COVID monoclonal antibody treatment.  After reviewing this information with the patient, the patient has agreed to receive one of the available covid 19 monoclonal  antibodies and will be provided an appropriate fact sheet prior to infusion. Scot Dock, NP 01/29/2021 12:49 PM

## 2021-01-31 ENCOUNTER — Ambulatory Visit (HOSPITAL_COMMUNITY)
Admission: RE | Admit: 2021-01-31 | Discharge: 2021-01-31 | Disposition: A | Discharge status: Home / Self Care | Payer: Medicare Other | Source: Ambulatory Visit | Attending: Pulmonary Disease | Admitting: Pulmonary Disease

## 2021-01-31 DIAGNOSIS — R54 Age-related physical debility: Secondary | ICD-10-CM | POA: Diagnosis not present

## 2021-01-31 DIAGNOSIS — U071 COVID-19: Secondary | ICD-10-CM | POA: Insufficient documentation

## 2021-01-31 MED ORDER — EPINEPHRINE 0.3 MG/0.3ML IJ SOAJ: 0.3000 mg | Freq: Once | INTRAMUSCULAR | Status: DC | PRN

## 2021-01-31 MED ORDER — ONDANSETRON HCL 4 MG/2ML IJ SOLN
4.0000 mg | Freq: Once | INTRAMUSCULAR | Status: AC
  Administered 2021-01-31: 4 mg via INTRAVENOUS
  Filled 2021-01-31: qty 2

## 2021-01-31 MED ORDER — SODIUM CHLORIDE 0.9 % IV SOLN: INTRAVENOUS | Status: DC | PRN

## 2021-01-31 MED ORDER — METHYLPREDNISOLONE SODIUM SUCC 125 MG IJ SOLR: 125.0000 mg | Freq: Once | INTRAMUSCULAR | Status: DC | PRN

## 2021-01-31 MED ORDER — SOTROVIMAB 500 MG/8ML IV SOLN
500.0000 mg | Freq: Once | INTRAVENOUS | Status: AC
  Administered 2021-01-31: 500 mg via INTRAVENOUS

## 2021-01-31 MED ORDER — ALBUTEROL SULFATE HFA 108 (90 BASE) MCG/ACT IN AERS: 2.0000 | INHALATION_SPRAY | Freq: Once | RESPIRATORY_TRACT | Status: DC | PRN

## 2021-01-31 MED ORDER — FAMOTIDINE IN NACL 20-0.9 MG/50ML-% IV SOLN: 20.0000 mg | Freq: Once | INTRAVENOUS | Status: DC | PRN

## 2021-01-31 MED ORDER — DIPHENHYDRAMINE HCL 50 MG/ML IJ SOLN: 50.0000 mg | Freq: Once | INTRAMUSCULAR | Status: DC | PRN

## 2021-01-31 NOTE — Progress Notes (Signed)
Diagnosis: COVID-19  Physician: Dr. Patrick Wright  Procedure: Covid Infusion Clinic Med: Sotrovimab infusion - Provided patient with sotrovimab fact sheet for patients, parents, and caregivers prior to infusion.   Complications: No immediate complications noted  Discharge: Discharged home    

## 2021-01-31 NOTE — Progress Notes (Signed)
Patient reviewed Fact Sheet for Patients, Parents, and Caregivers for Emergency Use Authorization (EUA) of sotrovimab for the Treatment of Coronavirus. Patient also reviewed and is agreeable to the estimated cost of treatment. Patient is agreeable to proceed.   

## 2021-01-31 NOTE — Discharge Instructions (Signed)

## 2021-02-14 ENCOUNTER — Ambulatory Visit
Admission: RE | Admit: 2021-02-14 | Discharge: 2021-02-14 | Disposition: A | Discharge status: Home / Self Care | Payer: Medicare Other | Source: Ambulatory Visit | Attending: Physician Assistant | Admitting: Physician Assistant

## 2021-02-14 ENCOUNTER — Other Ambulatory Visit: Payer: Self-pay | Admitting: Physician Assistant

## 2021-02-14 DIAGNOSIS — J019 Acute sinusitis, unspecified: Secondary | ICD-10-CM | POA: Diagnosis not present

## 2021-02-14 DIAGNOSIS — Z8616 Personal history of COVID-19: Secondary | ICD-10-CM | POA: Diagnosis not present

## 2021-02-14 DIAGNOSIS — R062 Wheezing: Secondary | ICD-10-CM

## 2021-02-14 DIAGNOSIS — R059 Cough, unspecified: Secondary | ICD-10-CM | POA: Diagnosis not present

## 2021-03-24 ENCOUNTER — Ambulatory Visit (INDEPENDENT_AMBULATORY_CARE_PROVIDER_SITE_OTHER): Payer: Medicare Other | Admitting: Otolaryngology

## 2021-03-24 ENCOUNTER — Other Ambulatory Visit: Payer: Self-pay

## 2021-03-24 VITALS — Temp 96.6°F

## 2021-03-24 DIAGNOSIS — H903 Sensorineural hearing loss, bilateral: Secondary | ICD-10-CM

## 2021-03-24 DIAGNOSIS — H6123 Impacted cerumen, bilateral: Secondary | ICD-10-CM

## 2021-03-24 NOTE — Progress Notes (Signed)
HPI: Randy Wise is a 75 y.o. male who presents for evaluation of wax buildup in his ears.  He had COVID about a month ago.  On exam by his medical doctor he was noted to have obstructed ear canals with wax.  He has had longstanding hearing loss and his wife recommended getting his hearing evaluated but he is not interested in hearing aids.  His niece is Advertising account executive.Randy Wise  Past Medical History:  Diagnosis Date  . CAD (coronary artery disease) 03/30/11   Mild nonobstructive disease per cath  . GERD (gastroesophageal reflux disease)   . HTN (hypertension)   . Hyperlipidemia   . Palpitations   . Prostatitis   . Symptomatic PVCs    Past Surgical History:  Procedure Laterality Date  . APPENDECTOMY    . CARDIAC CATHETERIZATION  April 2012   mild nonobstructive CAD with normal LV function. Left ventricular angiogram was performed in the RAO projection and showed normal left ventricular systolic function with ejection fraction of 65%  . fistula removal    . Nuclear Stress Test  2009   Ef-63%, Normal  . TONSILLECTOMY    . US ECHOCARDIOGRAPHY  2009   Normal, EF-65%   Social History   Socioeconomic History  . Marital status: Married    Spouse name: Not on file  . Number of children: Not on file  . Years of education: Not on file  . Highest education level: Not on file  Occupational History  . Not on file  Tobacco Use  . Smoking status: Never Smoker  . Smokeless tobacco: Never Used  Substance and Sexual Activity  . Alcohol use: No  . Drug use: No  . Sexual activity: Yes  Other Topics Concern  . Not on file  Social History Narrative  . Not on file   Social Determinants of Health   Financial Resource Strain: Not on file  Food Insecurity: Not on file  Transportation Needs: Not on file  Physical Activity: Not on file  Stress: Not on file  Social Connections: Not on file   Family History  Problem Relation Age of Onset  . Hypertension Brother   . Heart disease Father   .  Heart attack Father    No Known Allergies Prior to Admission medications   Medication Sig Start Date End Date Taking? Authorizing Provider  aspirin 81 MG tablet Take 81 mg by mouth daily.      [provider]  atorvastatin (LIPITOR) 10 MG tablet Take 1 tablet (10 mg total) by mouth daily. 04/16/11 04/15/12  Burtis Junes, NP  fish oil-omega-3 fatty acids 1000 MG capsule Take by mouth 2 (two) times daily.      [provider]  metoprolol succinate (TOPROL XL) 25 MG 24 hr tablet Take 1 tablet (25 mg total) by mouth daily. 10/19/12 10/19/13  Darlin Coco, MD  nitroGLYCERIN (NITROSTAT) 0.4 MG SL tablet Place 0.4 mg under the tongue every 5 (five) minutes as needed.      [provider]  omeprazole (PRILOSEC OTC) 20 MG tablet Take 20 mg by mouth daily.      [provider]     Positive ROS: Otherwise negative  All other systems have been reviewed and were otherwise negative with the exception of those mentioned in the HPI and as above.  Physical Exam: Constitutional: Alert, well-appearing, no acute distress Ears: External ears without lesions or tenderness. Ear canals large amount of wax right side worse than left.  This was  cleaned with curette suction and forceps.  TMs were clear bilaterally.  On hearing screening with the 1024 tuning fork he has moderate hearing loss in both ears.. Nasal: External nose without lesions. Clear nasal passages Oral: Oropharynx clear. Neck: No palpable adenopathy or masses Respiratory: Breathing comfortably  Skin: No facial/neck lesions or rash noted.  Cerumen impaction removal  Date/Time: 03/24/2021 1:05 PM Performed by: Rozetta Nunnery, MD Authorized by: Rozetta Nunnery, MD   Consent:    Consent obtained:  Verbal   Consent given by:  Patient   Risks discussed:  Pain and bleeding Procedure details:    Location:  L ear and R ear   Procedure type: curette, suction and forceps   Post-procedure  details:    Inspection:  TM intact and canal normal   Hearing quality:  Improved   Patient tolerance of procedure:  Tolerated well, no immediate complications Comments:     TMs are clear bilaterally.    Assessment: Bilateral cerumen buildup right side worse than left. Bilateral sensorineural hearing loss.  Plan: Would recommend evaluation with [facility] [place] and possible use of hearing aids. He will follow-up as needed.  [name], MD

## 2021-04-11 DIAGNOSIS — L259 Unspecified contact dermatitis, unspecified cause: Secondary | ICD-10-CM | POA: Diagnosis not present

## 2021-05-30 ENCOUNTER — Emergency Department (HOSPITAL_BASED_OUTPATIENT_CLINIC_OR_DEPARTMENT_OTHER): Payer: Medicare Other

## 2021-05-30 ENCOUNTER — Emergency Department (HOSPITAL_COMMUNITY): Payer: Medicare Other

## 2021-05-30 ENCOUNTER — Encounter (HOSPITAL_COMMUNITY): Payer: Self-pay | Admitting: Emergency Medicine

## 2021-05-30 ENCOUNTER — Other Ambulatory Visit: Payer: Self-pay

## 2021-05-30 ENCOUNTER — Ambulatory Visit (HOSPITAL_COMMUNITY)
Admission: EM | Admit: 2021-05-30 | Discharge: 2021-05-30 | Disposition: A | Discharge status: Home / Self Care | Payer: Medicare Other | Attending: Internal Medicine | Admitting: Internal Medicine

## 2021-05-30 ENCOUNTER — Emergency Department (HOSPITAL_COMMUNITY)
Admission: EM | Admit: 2021-05-30 | Discharge: 2021-05-31 | Disposition: A | Discharge status: Home / Self Care | Payer: Medicare Other | Attending: Emergency Medicine | Admitting: Emergency Medicine

## 2021-05-30 DIAGNOSIS — Z5321 Procedure and treatment not carried out due to patient leaving prior to being seen by health care provider: Secondary | ICD-10-CM | POA: Insufficient documentation

## 2021-05-30 DIAGNOSIS — R0789 Other chest pain: Secondary | ICD-10-CM | POA: Diagnosis not present

## 2021-05-30 DIAGNOSIS — M7989 Other specified soft tissue disorders: Secondary | ICD-10-CM

## 2021-05-30 DIAGNOSIS — R0602 Shortness of breath: Secondary | ICD-10-CM

## 2021-05-30 DIAGNOSIS — R600 Localized edema: Secondary | ICD-10-CM | POA: Diagnosis not present

## 2021-05-30 DIAGNOSIS — M79604 Pain in right leg: Secondary | ICD-10-CM

## 2021-05-30 DIAGNOSIS — R079 Chest pain, unspecified: Secondary | ICD-10-CM | POA: Diagnosis not present

## 2021-05-30 DIAGNOSIS — R2241 Localized swelling, mass and lump, right lower limb: Secondary | ICD-10-CM | POA: Diagnosis not present

## 2021-05-30 LAB — CBC WITH DIFFERENTIAL/PLATELET
Abs Immature Granulocytes: 0 10*3/uL (ref 0.00–0.07)
Basophils Absolute: 0 10*3/uL (ref 0.0–0.1)
Basophils Relative: 1 %
Eosinophils Absolute: 0.3 10*3/uL (ref 0.0–0.5)
Eosinophils Relative: 7 %
HCT: 45.7 % (ref 39.0–52.0)
Hemoglobin: 15.2 g/dL (ref 13.0–17.0)
Immature Granulocytes: 0 %
Lymphocytes Relative: 26 %
Lymphs Abs: 0.9 10*3/uL (ref 0.7–4.0)
MCH: 30.8 pg (ref 26.0–34.0)
MCHC: 33.3 g/dL (ref 30.0–36.0)
MCV: 92.7 fL (ref 80.0–100.0)
Monocytes Absolute: 0.4 10*3/uL (ref 0.1–1.0)
Monocytes Relative: 12 %
Neutro Abs: 1.8 10*3/uL (ref 1.7–7.7)
Neutrophils Relative %: 54 %
Platelets: 183 10*3/uL (ref 150–400)
RBC: 4.93 MIL/uL (ref 4.22–5.81)
RDW: 12.3 % (ref 11.5–15.5)
WBC: 3.4 10*3/uL — ABNORMAL LOW (ref 4.0–10.5)
nRBC: 0 % (ref 0.0–0.2)

## 2021-05-30 LAB — TROPONIN I (HIGH SENSITIVITY)
Troponin I (High Sensitivity): 3 ng/L (ref <18)
Troponin I (High Sensitivity): 3 ng/L (ref <18)

## 2021-05-30 LAB — COMPREHENSIVE METABOLIC PANEL
ALT: 19 U/L (ref 0–44)
AST: 24 U/L (ref 15–41)
Albumin: 3.7 g/dL (ref 3.5–5.0)
Alkaline Phosphatase: 57 U/L (ref 38–126)
Anion gap: 6 (ref 5–15)
BUN: 13 mg/dL (ref 8–23)
CO2: 27 mmol/L (ref 22–32)
Calcium: 9.1 mg/dL (ref 8.9–10.3)
Chloride: 104 mmol/L (ref 98–111)
Creatinine, Ser: 1.04 mg/dL (ref 0.61–1.24)
GFR, Estimated: >60 mL/min (ref >60)
Glucose, Bld: 102 mg/dL — ABNORMAL HIGH (ref 70–99)
Potassium: 4.4 mmol/L (ref 3.5–5.1)
Sodium: 137 mmol/L (ref 135–145)
Total Bilirubin: 0.7 mg/dL (ref 0.3–1.2)
Total Protein: 6.3 g/dL — ABNORMAL LOW (ref 6.5–8.1)

## 2021-05-30 NOTE — Progress Notes (Signed)
Lower extremity venous has been completed.   Preliminary results in CV Proc.   Abram Sander 05/30/2021 3:20 PM

## 2021-05-30 NOTE — ED Triage Notes (Signed)
[facility] for [facility]/o right calf swollen and pain for a week and left cp that started today at 12 pm.

## 2021-05-30 NOTE — ED Provider Notes (Signed)
[place]    CSN: [id number] Arrival date & time: 05/30/21  1257      History   Chief Complaint Chief Complaint  Patient presents with   Chest Pain   Leg Pain    HPI Randy Wise is a 75 y.o. male.   I evaluated patient in triage room at the request of nursing staff.  Patient reports a 1 week history of worsening right leg pain and tightness.  Over the past several hours he has developed shortness of breath that is worse with activity as well as chest tightness.  He denies any specific chest pain but states he feels as though there is pressure in his chest.  He denies history of VTE event including pulmonary embolism or DVT.  He did recently recover from COVID-19 within the past few months but denies any additional hypercoagulability risk factors including recent surgery, immobilization, history of malignancy, recent travel.  He reports chest discomfort is rated 7 on a 0-10 pain scale, localized to anterior chest, described as tightness/pressure, no aggravating or alleviating factors identified.   Past Medical History:  Diagnosis Date   CAD (coronary artery disease) 03/30/11   Mild nonobstructive disease per cath   GERD (gastroesophageal reflux disease)    HTN (hypertension)    Hyperlipidemia    Palpitations    Prostatitis    Symptomatic PVCs     Patient Active Problem List   Diagnosis Date Noted   Symptomatic PVCs 11/16/2011   CAD (coronary artery disease) 04/16/2011   GERD (gastroesophageal reflux disease)    Hyperlipidemia    Chest pain    HTN (hypertension)     Past Surgical History:  Procedure Laterality Date   APPENDECTOMY     CARDIAC CATHETERIZATION  April 2012   mild nonobstructive CAD with normal LV function. Left ventricular angiogram was performed in the RAO projection and showed normal left ventricular systolic function with ejection fraction of 65%   fistula removal     Nuclear Stress Test  2009   Ef-63%, Normal   TONSILLECTOMY     US  ECHOCARDIOGRAPHY  2009   Normal, EF-65%       Home Medications    Prior to Admission medications   Medication Sig Start Date End Date Taking? Authorizing Provider  omeprazole (PRILOSEC OTC) 20 MG tablet Take 20 mg by mouth daily.     Yes [provider]  aspirin 81 MG tablet Take 81 mg by mouth daily.      [provider]  atorvastatin (LIPITOR) 10 MG tablet Take 1 tablet (10 mg total) by mouth daily. 04/16/11 04/15/12  Burtis Junes, NP  fish oil-omega-3 fatty acids 1000 MG capsule Take by mouth 2 (two) times daily.      [provider]  metoprolol succinate (TOPROL XL) 25 MG 24 hr tablet Take 1 tablet (25 mg total) by mouth daily. 10/19/12 10/19/13  Darlin Coco, MD  nitroGLYCERIN (NITROSTAT) 0.4 MG SL tablet Place 0.4 mg under the tongue every 5 (five) minutes as needed.      [provider]    Family History Family History  Problem Relation Age of Onset   Hypertension Brother    Heart disease Father    Heart attack Father     Social History Social History   Tobacco Use   Smoking status: Never   Smokeless tobacco: Never  Substance Use Topics   Alcohol use: No   Drug use: No     Allergies  Patient has no known allergies.   Review of Systems Review of Systems  Constitutional:  Positive for activity change. Negative for appetite change, fatigue and fever.  Respiratory:  Positive for chest tightness and shortness of breath. Negative for cough.   Cardiovascular:  Positive for leg swelling. Negative for chest pain and palpitations.  Gastrointestinal:  Negative for abdominal pain, diarrhea, nausea and vomiting.  Musculoskeletal:  Positive for myalgias. Negative for arthralgias.  Neurological:  Positive for weakness (generalized). Negative for dizziness, light-headedness and headaches.    Physical Exam Triage Vital Signs ED Triage Vitals  Enc Vitals Group     BP 05/30/21 1303 138/77     Pulse Rate 05/30/21 1303 (!) 57      Resp 05/30/21 1303 16     Temp 05/30/21 1303 98.4 F (36.9 C)     Temp Source 05/30/21 1303 Oral     SpO2 05/30/21 1303 100 %     Weight --      Height --      Head Circumference --      Peak Flow --      Pain Score 05/30/21 1300 7     Pain Loc --      Pain Edu? --      Excl. in [place]? --    No data found.  Updated Vital Signs BP 138/77   Pulse (!) 57   Temp 98.4 F (36.9 C) (Oral)   Resp 16   SpO2 100%   Visual Acuity Right Eye Distance:   Left Eye Distance:   Bilateral Distance:    Right Eye Near:   Left Eye Near:    Bilateral Near:     Physical Exam Vitals reviewed.  Constitutional:      General: He is awake.     Appearance: Normal appearance. He is normal weight. He is not ill-appearing.     Comments: Very pleasant male appears stated age in no acute distress  HENT:     Head: Normocephalic and atraumatic.  Cardiovascular:     Rate and Rhythm: Normal rate and regular rhythm.     Heart sounds: Normal heart sounds, S1 normal and S2 normal. No murmur heard.    Comments: Right calf measures 43 cm and left calf measures 39 cm.  Positive [place] on right. Pulmonary:     Effort: Pulmonary effort is normal.     Breath sounds: Normal breath sounds. No stridor. No wheezing, rhonchi or rales.     Comments: Clear to auscultation bilaterally Abdominal:     General: Bowel sounds are normal.     Palpations: Abdomen is soft.     Tenderness: There is no abdominal tenderness.  Musculoskeletal:     Right lower leg: No edema.     Left lower leg: No edema.  Neurological:     Mental Status: He is alert.  Psychiatric:        Behavior: Behavior is cooperative.      UC Treatments / Results  Labs (all labs ordered are listed, but only abnormal results are displayed) Labs Reviewed - No data to display  EKG   Radiology No results found.  Procedures Procedures (including critical care time)  Medications Ordered in UC Medications - No data to display  Initial  Impression / Assessment and Plan / UC Course  I have reviewed the triage vital signs and the nursing notes.  Pertinent labs & imaging results that were available during my care of the patient were reviewed by  me and considered in my medical decision making (see chart for details).     Given chest pain, shortness of breath with leg pain in the setting of recent COVID-19 infection patient was instructed to go to the emergency room for further evaluation and to rule out pulmonary embolism.  EKG obtained in triage showed normal sinus rhythm with ventricular rate of 67 bpm with nonspecific ST changes in lead III and aVF with late R wave progression in comparison to 11/16/2011 tracing.  Discussed potentiality of going by EMS but patient declined this as he is not feeling poorly and his vital signs are stable.  Vital signs are stable at the time of discharge and patient will go via private transport to [facility], ER for further evaluation immediately following visit.  Final Clinical Impressions(s) / UC Diagnoses   Final diagnoses:  Right leg pain  Right leg swelling  Chest tightness  Shortness of breath     Discharge Instructions      Go to the emergency room to rule out DVT and pulmonary embolism     ED Prescriptions   None    PDMP not reviewed this encounter.   Terrilee Croak, PA-C 05/30/21 1329

## 2021-05-30 NOTE — ED Notes (Signed)
Patient is being discharged from the Urgent Care and sent to the Emergency Department via private vehicle. Per Verna Czech, APP, patient is in need of higher level of care due to r/o DVT and PE. Patient is aware and verbalizes understanding of plan of care.  Vitals:   05/30/21 1303  BP: 138/77  Pulse: (!) 57  Resp: 16  Temp: 98.4 F (36.9 C)  SpO2: 100%

## 2021-05-30 NOTE — ED Provider Notes (Signed)
Emergency Medicine Provider Triage Evaluation Note  Randy Wise , a 75 y.o. male  was evaluated in triage.  Pt complains of leg pain and swelling. Pt states that he had COVID about 8 weeks ago. 1 week ago he began developed right leg pain and swelling. It initially started in the right posterior thigh, resolved and then spread to the right calf. He states "it feels like my calf is going to bust open". This morning he began experiencing an episode of left sided chest tightness with SOB. States his sx mostly alleviated after about 1 hour. He states that he still has some mild chest tightness but no SOB. Standing up straight would make his sx improve.   Physical Exam  BP 137/76   Pulse 65   Temp 99 F (37.2 C) (Oral)   Resp 14   SpO2 99%  Gen:   Awake, no distress   Resp:  Normal effort  MSK:   Moves extremities without difficulty  Other:    Medical Decision Making  Medically screening exam initiated at 2:24 PM.  Appropriate orders placed.  Drake Leach Conant was informed that the remainder of the evaluation will be completed by another provider, this initial triage assessment does not replace that evaluation, and the importance of remaining in the ED until their evaluation is complete.     Rayna Sexton, PA-C 05/30/21 1426    Horton, Alvin Critchley, DO 06/02/21 1748

## 2021-05-30 NOTE — ED Triage Notes (Signed)
Right leg pain for 1 week. Thought he may have injured it at the gym. Feels like calf is swollen.   Reports left chest tightness started one hour ago.

## 2021-05-30 NOTE — Discharge Instructions (Addendum)
Go to the emergency room to rule out DVT and pulmonary embolism

## 2021-05-31 NOTE — ED Notes (Signed)
Pt called for vitals, no respnse

## 2021-05-31 NOTE — ED Notes (Signed)
Pt no response after multiple attempts to call for vitals. Moving OTF.

## 2021-06-04 ENCOUNTER — Encounter (HOSPITAL_COMMUNITY): Payer: Self-pay | Admitting: Emergency Medicine

## 2021-06-04 ENCOUNTER — Emergency Department (HOSPITAL_BASED_OUTPATIENT_CLINIC_OR_DEPARTMENT_OTHER): Payer: Medicare Other

## 2021-06-04 ENCOUNTER — Emergency Department (HOSPITAL_COMMUNITY)
Admission: EM | Admit: 2021-06-04 | Discharge: 2021-06-05 | Disposition: A | Discharge status: Home / Self Care | Payer: Medicare Other | Attending: Emergency Medicine | Admitting: Emergency Medicine

## 2021-06-04 DIAGNOSIS — M79601 Pain in right arm: Secondary | ICD-10-CM | POA: Diagnosis not present

## 2021-06-04 DIAGNOSIS — I10 Essential (primary) hypertension: Secondary | ICD-10-CM | POA: Insufficient documentation

## 2021-06-04 DIAGNOSIS — R1013 Epigastric pain: Secondary | ICD-10-CM | POA: Diagnosis not present

## 2021-06-04 DIAGNOSIS — I2510 Atherosclerotic heart disease of native coronary artery without angina pectoris: Secondary | ICD-10-CM | POA: Diagnosis not present

## 2021-06-04 DIAGNOSIS — M79604 Pain in right leg: Secondary | ICD-10-CM | POA: Diagnosis not present

## 2021-06-04 DIAGNOSIS — M79605 Pain in left leg: Secondary | ICD-10-CM | POA: Diagnosis not present

## 2021-06-04 DIAGNOSIS — M79602 Pain in left arm: Secondary | ICD-10-CM | POA: Diagnosis not present

## 2021-06-04 DIAGNOSIS — M79662 Pain in left lower leg: Secondary | ICD-10-CM

## 2021-06-04 NOTE — ED Triage Notes (Signed)
Pt was here on the 1st with c/o cp and leg pain and lab work and [place] all which were normal

## 2021-06-04 NOTE — ED Provider Notes (Signed)
Emergency Medicine Provider Triage Evaluation Note  Randy Wise , a 75 y.o. male  was evaluated in triage.  Pt complains of complains of myalgias to bilateral upper and lower legs.  Patient also expresses concern over lab results and ultrasound which were obtained on 7/1.  Patient was COVID-positive 8 weeks prior.  Patient had ultrasound of right lower extremity which showed no DVT.  Patient had 2 negative troponins.  Previous lab work.  Patient denies chest pain or shortness of breath at this  Review of Systems  Positive: Myalgias Negative: Chest pain, shortness of breath, abdominal pain, nausea, vomiting, leg swelling  Physical Exam  BP 127/82 (BP Location: Left Arm)   Pulse 79   Temp 98.2 F (36.8 C)   Resp 18   SpO2 99%  Gen:   Awake, no distress   Resp:  Normal effort  MSK:   Moves extremities without difficulty.  Patient tenderness.  No swelling or edema noted.  No tenderness or swelling noted to bilateral upper extremities. Other:    Medical Decision Making  Medically screening exam initiated at 5:28 PM.  Appropriate orders placed.  Randy Wise was informed that the remainder of the evaluation will be completed by another provider, this initial triage assessment does not replace that evaluation, and the importance of remaining in the ED until their evaluation is complete.  Due to patient's recent COVID-19 her possible DVT to left lower extremity.  Will obtain ultrasound imaging to rule out   Randy Beckwith, PA-C 06/04/21 1730    Horton, Alvin Critchley, DO 06/05/21 0010

## 2021-06-04 NOTE — Progress Notes (Signed)
Lower extremity venous LT study completed.  Preliminary results relayed to [place], [place].  See CV Proc for preliminary results report.   Darlin Coco, RDMS, RVT

## 2021-06-05 DIAGNOSIS — R1013 Epigastric pain: Secondary | ICD-10-CM | POA: Diagnosis not present

## 2021-06-05 LAB — CBC WITH DIFFERENTIAL/PLATELET
Abs Immature Granulocytes: 0.01 10*3/uL (ref 0.00–0.07)
Basophils Absolute: 0 10*3/uL (ref 0.0–0.1)
Basophils Relative: 1 %
Eosinophils Absolute: 0.2 10*3/uL (ref 0.0–0.5)
Eosinophils Relative: 5 %
HCT: 48.3 % (ref 39.0–52.0)
Hemoglobin: 16 g/dL (ref 13.0–17.0)
Immature Granulocytes: 0 %
Lymphocytes Relative: 27 %
Lymphs Abs: 1 10*3/uL (ref 0.7–4.0)
MCH: 30.7 pg (ref 26.0–34.0)
MCHC: 33.1 g/dL (ref 30.0–36.0)
MCV: 92.5 fL (ref 80.0–100.0)
Monocytes Absolute: 0.5 10*3/uL (ref 0.1–1.0)
Monocytes Relative: 13 %
Neutro Abs: 2.1 10*3/uL (ref 1.7–7.7)
Neutrophils Relative %: 54 %
Platelets: 191 10*3/uL (ref 150–400)
RBC: 5.22 MIL/uL (ref 4.22–5.81)
RDW: 12.4 % (ref 11.5–15.5)
WBC: 3.8 10*3/uL — ABNORMAL LOW (ref 4.0–10.5)
nRBC: 0 % (ref 0.0–0.2)

## 2021-06-05 LAB — COMPREHENSIVE METABOLIC PANEL
ALT: 21 U/L (ref 0–44)
AST: 20 U/L (ref 15–41)
Albumin: 4 g/dL (ref 3.5–5.0)
Alkaline Phosphatase: 61 U/L (ref 38–126)
Anion gap: 7 (ref 5–15)
BUN: 18 mg/dL (ref 8–23)
CO2: 27 mmol/L (ref 22–32)
Calcium: 9.3 mg/dL (ref 8.9–10.3)
Chloride: 104 mmol/L (ref 98–111)
Creatinine, Ser: 1.11 mg/dL (ref 0.61–1.24)
GFR, Estimated: >60 mL/min (ref >60)
Glucose, Bld: 107 mg/dL — ABNORMAL HIGH (ref 70–99)
Potassium: 4.1 mmol/L (ref 3.5–5.1)
Sodium: 138 mmol/L (ref 135–145)
Total Bilirubin: 0.6 mg/dL (ref 0.3–1.2)
Total Protein: 6.8 g/dL (ref 6.5–8.1)

## 2021-06-05 LAB — LIPASE, BLOOD: Lipase: 36 U/L (ref 11–51)

## 2021-06-05 LAB — TROPONIN I (HIGH SENSITIVITY): Troponin I (High Sensitivity): 2 ng/L (ref <18)

## 2021-06-05 MED ORDER — ALUM & MAG HYDROXIDE-SIMETH 200-200-20 MG/5ML PO SUSP
30.0000 mL | Freq: Once | ORAL | Status: AC
  Administered 2021-06-05: 30 mL via ORAL
  Filled 2021-06-05: qty 30

## 2021-06-05 MED ORDER — LIDOCAINE VISCOUS HCL 2 % MT SOLN: 15.0000 mL | Freq: Four times a day (QID) | OROMUCOSAL | 0 refills | Status: AC | PRN

## 2021-06-05 MED ORDER — LIDOCAINE VISCOUS HCL 2 % MT SOLN
15.0000 mL | Freq: Once | OROMUCOSAL | Status: AC
  Administered 2021-06-05: 15 mL via ORAL
  Filled 2021-06-05: qty 15

## 2021-06-05 MED ORDER — ALUM & MAG HYDROXIDE-SIMETH 400-400-40 MG/5ML PO SUSP: 10.0000 mL | Freq: Four times a day (QID) | ORAL | 0 refills | Status: AC | PRN

## 2021-06-05 MED ORDER — ALUM & MAG HYDROXIDE-SIMETH 400-400-40 MG/5ML PO SUSP
10.0000 mL | Freq: Four times a day (QID) | ORAL | 0 refills | Status: DC | PRN
Start: 2021-06-05 — End: 2021-06-05

## 2021-06-05 MED ORDER — HYOSCYAMINE SULFATE 0.125 MG SL SUBL
0.2500 mg | SUBLINGUAL_TABLET | Freq: Once | SUBLINGUAL | Status: AC
  Administered 2021-06-05: 0.25 mg via SUBLINGUAL
  Filled 2021-06-05: qty 2

## 2021-06-05 NOTE — ED Provider Notes (Addendum)
[facility] EMERGENCY DEPARTMENT Provider Note  CSN: [id number] Arrival date & time: 06/04/21 1613  Chief Complaint(s) Abdominal Pain  HPI Randy Wise is a 75 y.o. male   The history is provided by the patient.  Abdominal Pain Pain location:  Epigastric Pain quality: aching and sharp   Pain radiates to:  Does not radiate Pain severity:  Moderate Duration:  2 weeks Timing:  Intermittent Progression:  Waxing and waning Chronicity:  New Relieved by: being upright and being active. Worsened by:  Palpation and position changes Associated symptoms: no cough, no fever, no nausea, no shortness of breath and no vomiting    Patient also complained of bilateral leg and arm pain similar to prior tendinitis. No falls or trauma. No prior clots.   Past Medical History Past Medical History:  Diagnosis Date   CAD (coronary artery disease) 03/30/11   Mild nonobstructive disease per cath   GERD (gastroesophageal reflux disease)    HTN (hypertension)    Hyperlipidemia    Palpitations    Prostatitis    Symptomatic PVCs    Patient Active Problem List   Diagnosis Date Noted   Symptomatic PVCs 11/16/2011   CAD (coronary artery disease) 04/16/2011   GERD (gastroesophageal reflux disease)    Hyperlipidemia    Chest pain    HTN (hypertension)    Home Medication(s) Prior to Admission medications   Medication Sig Start Date End Date Taking? Authorizing Provider  cholecalciferol (VITAMIN D) 25 MCG (1000 UNIT) tablet Take 1,000 Units by mouth daily.   Yes [provider]  fish oil-omega-3 fatty acids 1000 MG capsule Take 1 g by mouth daily.   Yes [provider]  nitroGLYCERIN (NITROSTAT) 0.4 MG SL tablet Place 0.4 mg under the tongue every 5 (five) minutes as needed.     Yes [provider]  omeprazole (PRILOSEC OTC) 20 MG tablet Take 20 mg by mouth daily.     Yes [provider]  Potassium Bicarbonate 99 MG CAPS Take 99 mg by mouth  daily.   Yes [provider]  saw palmetto 500 MG capsule Take 500 mg by mouth daily.   Yes [provider]  atorvastatin (LIPITOR) 10 MG tablet Take 1 tablet (10 mg total) by mouth daily. Patient not taking: Reported on 06/05/2021 04/16/11 04/15/12  Burtis Junes, NP  metoprolol succinate (TOPROL XL) 25 MG 24 hr tablet Take 1 tablet (25 mg total) by mouth daily. Patient not taking: Reported on 06/05/2021 10/19/12 10/19/13  Darlin Coco, MD                                                                                                                                    Past Surgical History Past Surgical History:  Procedure Laterality Date   APPENDECTOMY     CARDIAC CATHETERIZATION  April 2012   mild nonobstructive CAD with normal LV function. Left ventricular angiogram  was performed in the RAO projection and showed normal left ventricular systolic function with ejection fraction of 65%   fistula removal     Nuclear Stress Test  2009   Ef-63%, Normal   TONSILLECTOMY     US ECHOCARDIOGRAPHY  2009   Normal, EF-65%   Family History Family History  Problem Relation Age of Onset   Hypertension Brother    Heart disease Father    Heart attack Father     Social History Social History   Tobacco Use   Smoking status: Never   Smokeless tobacco: Never  Substance Use Topics   Alcohol use: No   Drug use: No   Allergies Patient has no known allergies.  Review of Systems Review of Systems  Constitutional:  Negative for fever.  Respiratory:  Negative for cough and shortness of breath.   Gastrointestinal:  Positive for abdominal pain. Negative for nausea and vomiting.  All other systems are reviewed and are negative for acute change except as noted in the HPI  Physical Exam Vital Signs  I have reviewed the triage vital signs BP 137/78 (BP Location: Left Arm)   Pulse 60   Temp 98.2 F (36.8 C) (Oral)   Resp 16   SpO2 100%   Physical Exam Vitals reviewed.   Constitutional:      General: He is not in acute distress.    Appearance: He is well-developed. He is not diaphoretic.  HENT:     Head: Normocephalic and atraumatic.     Nose: Nose normal.  Eyes:     General: No scleral icterus.       Right eye: No discharge.        Left eye: No discharge.     Conjunctiva/sclera: Conjunctivae normal.     Pupils: Pupils are equal, round, and reactive to light.  Cardiovascular:     Rate and Rhythm: Normal rate and regular rhythm.     Heart sounds: No murmur heard.   No friction rub. No gallop.  Pulmonary:     Effort: Pulmonary effort is normal. No respiratory distress.     Breath sounds: Normal breath sounds. No stridor. No rales.  Abdominal:     General: There is no distension.     Palpations: Abdomen is soft.     Tenderness: There is abdominal tenderness in the epigastric area. There is no guarding or rebound.    Musculoskeletal:        General: No tenderness.     Cervical back: Normal range of motion and neck supple.  Skin:    General: Skin is warm and dry.     Findings: No erythema or rash.  Neurological:     Mental Status: He is alert and oriented to person, place, and time.    ED Results and Treatments Labs (all labs ordered are listed, but only abnormal results are displayed) Labs Reviewed  CBC WITH DIFFERENTIAL/PLATELET  COMPREHENSIVE METABOLIC PANEL  LIPASE, BLOOD  TROPONIN I (HIGH SENSITIVITY)  EKG  EKG Interpretation  Date/Time:  Thursday June 05 2021 06:12:04 EDT Ventricular Rate:  60 PR Interval:  194 QRS Duration: 76 QT Interval:  397 QTC Calculation: 397 R Axis:   15 Text Interpretation: Sinus rhythm No STEMI No acute changes Confirmed by Addison Lank [phone]) on 06/05/2021 6:19:22 AM        Radiology VAS [place] LOWER EXTREMITY VENOUS (DVT)  Result Date: 06/04/2021  Lower Venous DVT Study  Patient Name:  MANFORD SPRONG  Date of Exam:   06/04/2021 Medical Rec #: [id number]          Accession #:    [id number] Date of Birth: 1945-12-18          Patient Gender: M Patient Age:   74Y Exam Location:  [facility] Procedure:      VAS [place] LOWER EXTREMITY VENOUS (DVT) Referring Phys: [id number] Rudell Cobb BADALAMENTE --------------------------------------------------------------------------------  Indications: Left calf pain, recent Covid.  Comparison Study: 05-30-2021 Right lower extremity venous was negative for DVT. Performing Technologist: Darlin Coco RDMS,RVT  Examination Guidelines: A complete evaluation includes B-mode imaging, spectral Doppler, color Doppler, and power Doppler as needed of all accessible portions of each vessel. Bilateral testing is considered an integral part of a complete examination. Limited examinations for reoccurring indications may be performed as noted. The reflux portion of the exam is performed with the patient in reverse Trendelenburg.  +-----+---------------+---------+-----------+----------+--------------+ RIGHTCompressibilityPhasicitySpontaneityPropertiesThrombus Aging +-----+---------------+---------+-----------+----------+--------------+ CFV  Full           Yes      Yes                                 +-----+---------------+---------+-----------+----------+--------------+   +---------+---------------+---------+-----------+----------+--------------+ LEFT     CompressibilityPhasicitySpontaneityPropertiesThrombus Aging +---------+---------------+---------+-----------+----------+--------------+ CFV      Full           Yes      Yes                                 +---------+---------------+---------+-----------+----------+--------------+ SFJ      Full                                                        +---------+---------------+---------+-----------+----------+--------------+ FV Prox  Full                                                         +---------+---------------+---------+-----------+----------+--------------+ FV Mid   Full                                                        +---------+---------------+---------+-----------+----------+--------------+ FV DistalFull                                                        +---------+---------------+---------+-----------+----------+--------------+  PFV      Full                                                        +---------+---------------+---------+-----------+----------+--------------+ POP      Full           Yes      Yes                                 +---------+---------------+---------+-----------+----------+--------------+ PTV      Full                                                        +---------+---------------+---------+-----------+----------+--------------+ PERO     Full                                                        +---------+---------------+---------+-----------+----------+--------------+ Gastroc  Full                                                        +---------+---------------+---------+-----------+----------+--------------+     Summary: RIGHT: - No evidence of common femoral vein obstruction.  LEFT: - There is no evidence of deep vein thrombosis in the lower extremity.  - No cystic structure found in the popliteal fossa.  *See table(s) above for measurements and observations.    Preliminary     Pertinent labs & imaging results that were available during my care of the patient were reviewed by me and considered in my medical decision making (see chart for details).  Medications Ordered in ED Medications  alum & mag hydroxide-simeth (MAALOX/MYLANTA) 200-200-20 MG/5ML suspension 30 mL (30 mLs Oral Given 06/05/21 0614)    And  lidocaine (XYLOCAINE) 2 % viscous mouth solution 15 mL (15 mLs Oral Given 06/05/21 0614)  hyoscyamine (LEVSIN SL) SL tablet 0.25 mg (0.25 mg Sublingual Given 06/05/21 1194)                                                                                                                                     Procedures Procedures  (including critical care time)  Medical Decision Making / ED Course I have reviewed the nursing notes for this encounter  and the patient's prior records (if available in EHR or on provided paperwork).   IDREES QUAM was evaluated in Emergency Department on 06/05/2021 for the symptoms described in the history of present illness. He was evaluated in the context of the global COVID-19 pandemic, which necessitated consideration that the patient might be at risk for infection with the SARS-CoV-2 virus that causes COVID-19. Institutional protocols and algorithms that pertain to the evaluation of patients at risk for COVID-19 are in a state of rapid change based on information released by regulatory bodies including the CDC and federal and state organizations. These policies and algorithms were followed during the patient's care in the ED.  Epigastric abdominal pain with lying down. Improved with being upright and exertion/activity. Highly inconsistent with ACS. EKG without acute ischemic changes or evidence of pericarditis. Seen here last week for same pain and had negative cardiac work-up. Will obtain a single troponin given the persistence of his pain.  Feel this will be sufficient to rule out cardiac process Screening labs to assess for any biliary obstruction or pancreatitis.  Though low suspicion.  Will treat for possible GERD with GI cocktail.  Patient was seen in the MSE process and had a DVT ultrasound ordered for leg pain which was negative..  Patient care turned over to oncoming provider. Patient case and results discussed in detail; please see their note for further ED managment.         Final Clinical Impression(s) / ED Diagnoses Final diagnoses:  Epigastric abdominal pain      This chart was dictated using voice recognition  software.  Despite best efforts to proofread,  errors can occur which can change the documentation meaning.      Fatima Blank, MD 06/05/21 [phone]

## 2021-06-11 DIAGNOSIS — M25561 Pain in right knee: Secondary | ICD-10-CM | POA: Diagnosis not present

## 2021-06-11 DIAGNOSIS — S86111A Strain of other muscle(s) and tendon(s) of posterior muscle group at lower leg level, right leg, initial encounter: Secondary | ICD-10-CM | POA: Diagnosis not present

## 2021-07-21 DIAGNOSIS — Z86010 Personal history of colonic polyps: Secondary | ICD-10-CM | POA: Diagnosis not present

## 2021-07-21 DIAGNOSIS — K921 Melena: Secondary | ICD-10-CM | POA: Diagnosis not present

## 2021-07-21 DIAGNOSIS — R194 Change in bowel habit: Secondary | ICD-10-CM | POA: Diagnosis not present

## 2021-09-02 DIAGNOSIS — H43813 Vitreous degeneration, bilateral: Secondary | ICD-10-CM | POA: Diagnosis not present

## 2021-09-02 DIAGNOSIS — H16143 Punctate keratitis, bilateral: Secondary | ICD-10-CM | POA: Diagnosis not present

## 2021-09-02 DIAGNOSIS — H16223 Keratoconjunctivitis sicca, not specified as Sjogren's, bilateral: Secondary | ICD-10-CM | POA: Diagnosis not present

## 2021-09-02 DIAGNOSIS — H2513 Age-related nuclear cataract, bilateral: Secondary | ICD-10-CM | POA: Diagnosis not present

## 2021-10-20 DIAGNOSIS — K219 Gastro-esophageal reflux disease without esophagitis: Secondary | ICD-10-CM | POA: Diagnosis not present

## 2021-10-20 DIAGNOSIS — E785 Hyperlipidemia, unspecified: Secondary | ICD-10-CM | POA: Diagnosis not present

## 2021-10-20 DIAGNOSIS — N400 Enlarged prostate without lower urinary tract symptoms: Secondary | ICD-10-CM | POA: Diagnosis not present

## 2021-10-20 DIAGNOSIS — E7800 Pure hypercholesterolemia, unspecified: Secondary | ICD-10-CM | POA: Diagnosis not present

## 2021-11-05 DIAGNOSIS — I493 Ventricular premature depolarization: Secondary | ICD-10-CM | POA: Diagnosis not present

## 2021-11-05 DIAGNOSIS — Z1389 Encounter for screening for other disorder: Secondary | ICD-10-CM | POA: Diagnosis not present

## 2021-11-05 DIAGNOSIS — E785 Hyperlipidemia, unspecified: Secondary | ICD-10-CM | POA: Diagnosis not present

## 2021-11-05 DIAGNOSIS — Z23 Encounter for immunization: Secondary | ICD-10-CM | POA: Diagnosis not present

## 2021-11-05 DIAGNOSIS — N400 Enlarged prostate without lower urinary tract symptoms: Secondary | ICD-10-CM | POA: Diagnosis not present

## 2021-11-05 DIAGNOSIS — Z0000 Encounter for general adult medical examination without abnormal findings: Secondary | ICD-10-CM | POA: Diagnosis not present

## 2021-11-12 DIAGNOSIS — L814 Other melanin hyperpigmentation: Secondary | ICD-10-CM | POA: Diagnosis not present

## 2021-11-12 DIAGNOSIS — D225 Melanocytic nevi of trunk: Secondary | ICD-10-CM | POA: Diagnosis not present

## 2021-11-12 DIAGNOSIS — L570 Actinic keratosis: Secondary | ICD-10-CM | POA: Diagnosis not present

## 2021-11-12 DIAGNOSIS — L821 Other seborrheic keratosis: Secondary | ICD-10-CM | POA: Diagnosis not present

## 2021-11-12 DIAGNOSIS — L918 Other hypertrophic disorders of the skin: Secondary | ICD-10-CM | POA: Diagnosis not present

## 2022-01-26 ENCOUNTER — Other Ambulatory Visit: Payer: Self-pay

## 2022-01-26 ENCOUNTER — Ambulatory Visit
Admission: EM | Admit: 2022-01-26 | Discharge: 2022-01-26 | Disposition: A | Discharge status: Home / Self Care | Payer: Medicare Other | Attending: Family Medicine | Admitting: Family Medicine

## 2022-01-26 DIAGNOSIS — L291 Pruritus scroti: Secondary | ICD-10-CM | POA: Diagnosis not present

## 2022-01-26 NOTE — ED Provider Notes (Signed)
°  [place]   [id number] 01/26/22 Arrival Time: 0806  ASSESSMENT & PLAN:  1. Scrotal itching    No signs of bacterial skin infection. Suspect tinea cruris; mild. Plans to use OTC clotrimazole cream BID for next 1-2 weeks.  Will return if not improving. Keep area dry.  Reviewed expectations re: course of current medical issues. Questions answered. Outlined signs and symptoms indicating need for more acute intervention. Patient verbalized understanding. After Visit Summary given.   SUBJECTIVE:  Randy Wise is a 76 y.o. male who presents with a skin complaint. Scrotal/groin inching with skin redness. On/off over past month; current exacerbation sev days. Afebrile. OTC Gold Bond powder without relief. Normal bowel/bladder habits.   OBJECTIVE: Vitals:   01/26/22 0844  BP: (!) 150/77  Pulse: 65  Resp: 18  Temp: 98 F (36.7 C)  TempSrc: Oral  SpO2: 95%    General appearance: alert; no distress HEENT: [place]; AT Neck: supple with FROM Extremities: no edema; moves all extremities normally Skin: warm and dry; groin with diffuse mild erythema, over scrotum also; no signs of abscess formation or cellulitis Psychological: alert and cooperative; normal mood and affect  No Known Allergies  Past Medical History:  Diagnosis Date   CAD (coronary artery disease) 03/30/11   Mild nonobstructive disease per cath   GERD (gastroesophageal reflux disease)    HTN (hypertension)    Hyperlipidemia    Palpitations    Prostatitis    Symptomatic PVCs    Social History   Socioeconomic History   Marital status: Married    Spouse name: Not on file   Number of children: Not on file   Years of education: Not on file   Highest education level: Not on file  Occupational History   Not on file  Tobacco Use   Smoking status: Never   Smokeless tobacco: Never  Substance and Sexual Activity   Alcohol use: No   Drug use: No   Sexual activity: Yes  Other Topics Concern   Not  on file  Social History Narrative   Not on file   Social Determinants of Health   Financial Resource Strain: Not on file  Food Insecurity: Not on file  Transportation Needs: Not on file  Physical Activity: Not on file  Stress: Not on file  Social Connections: Not on file  Intimate Partner Violence: Not on file   Family History  Problem Relation Age of Onset   Hypertension Brother    Heart disease Father    Heart attack Father    Past Surgical History:  Procedure Laterality Date   APPENDECTOMY     CARDIAC CATHETERIZATION  April 2012   mild nonobstructive CAD with normal LV function. Left ventricular angiogram was performed in the RAO projection and showed normal left ventricular systolic function with ejection fraction of 65%   fistula removal     Nuclear Stress Test  2009   Ef-63%, Normal   TONSILLECTOMY     US ECHOCARDIOGRAPHY  2009   Normal, EF-65%      Vanessa Kick, MD 01/26/22 1240

## 2022-01-26 NOTE — ED Triage Notes (Signed)
Two week h/o groin and penile itching and redness. Has tried Gold Bond and other ointments without relief. Pt is suspicious for fungus noting that he works out often. Denies pain. Denies rash, but Pt's wife reported seeing a bump on his penis.

## 2022-03-17 IMAGING — DX DG CHEST 1V
1 series · 1 of 1 positions shown · non-contrast
Comparison: 02/14/2021 prior radiographs

CLINICAL DATA: Acute chest pain and leg swelling.

EXAM:
CHEST  1 VIEW

[w chest pa]
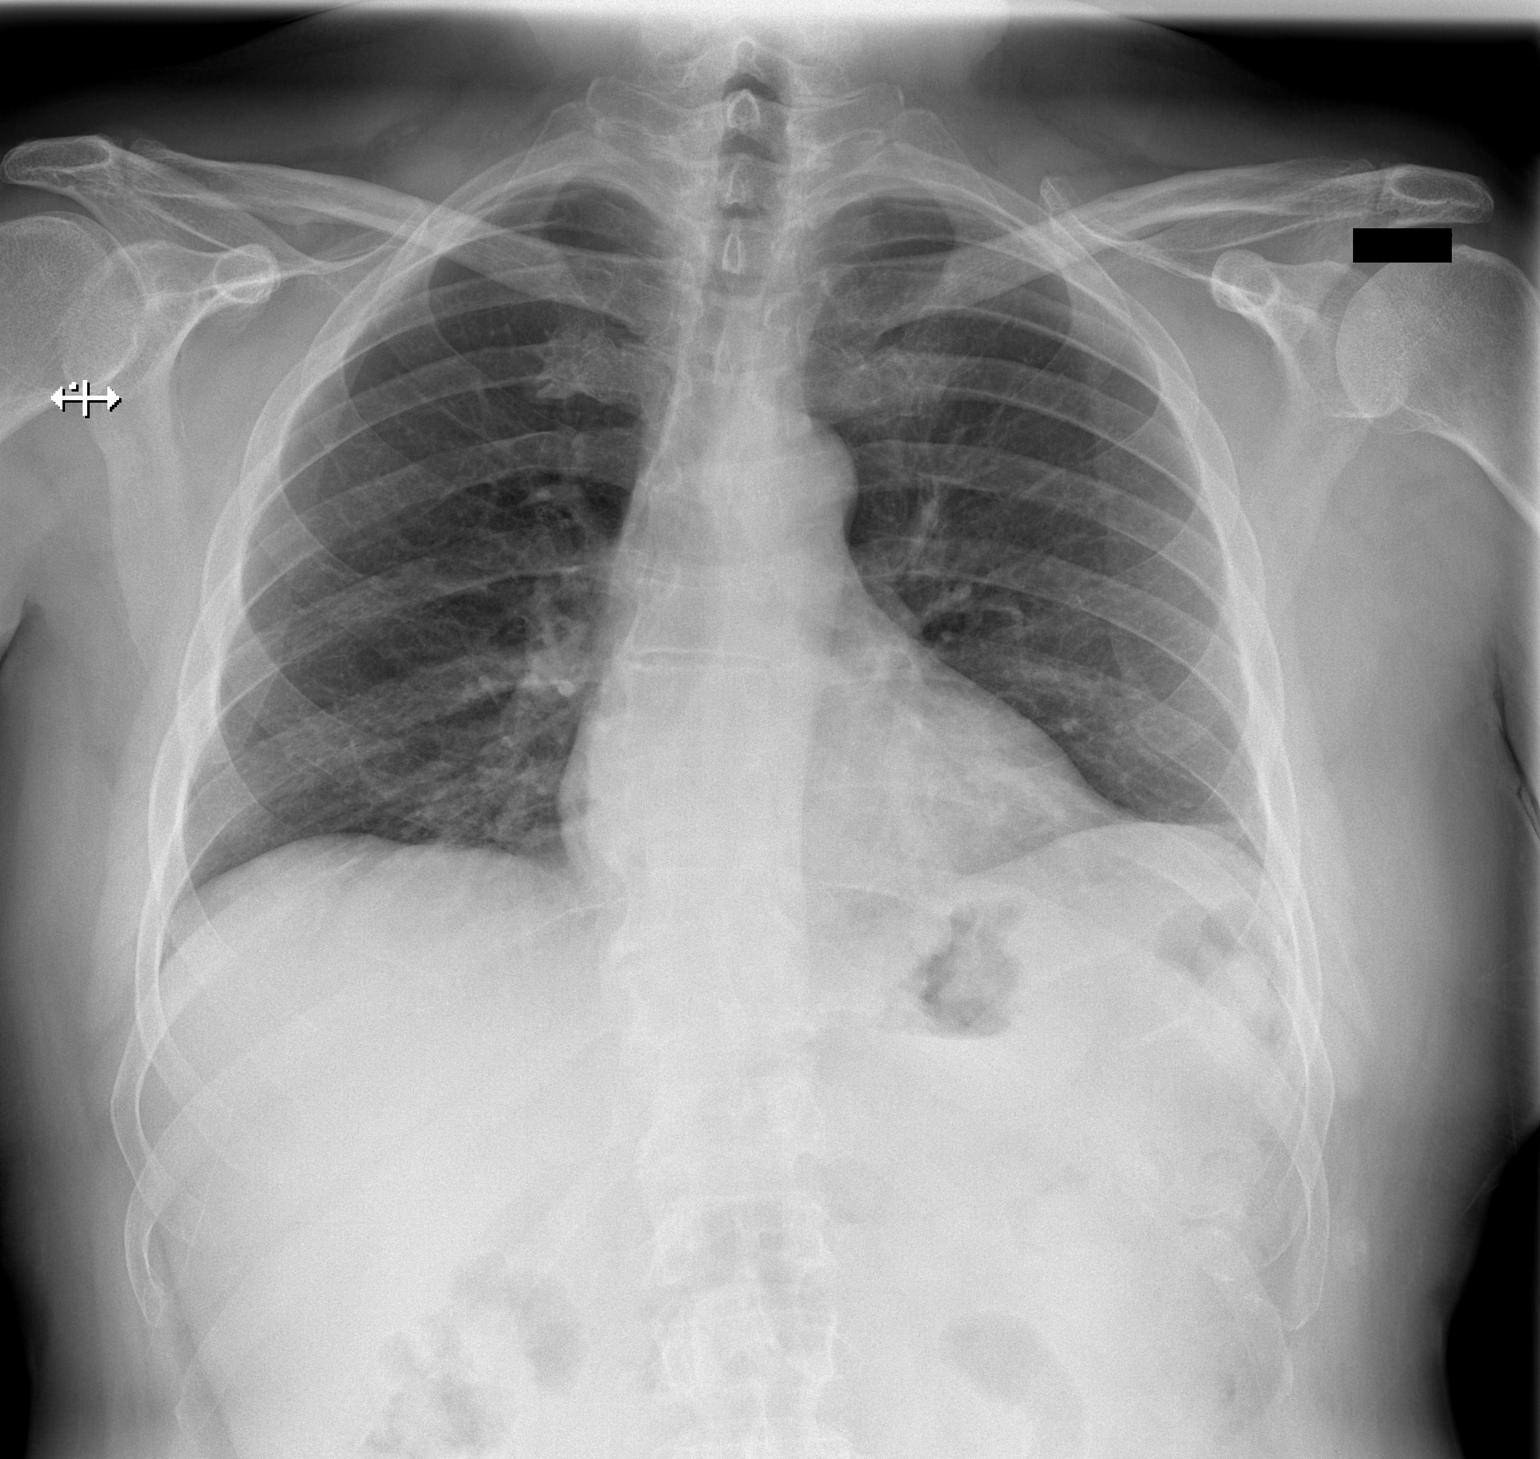

[1 of 1 positions shown; findings below may reference images not displayed]

FINDINGS: The cardiomediastinal silhouette is unremarkable.

There is no evidence of focal airspace disease, pulmonary edema,
suspicious pulmonary nodule/mass, pleural effusion, or pneumothorax.

No acute bony abnormalities are identified.
IMPRESSION: No active disease.

## 2022-05-07 ENCOUNTER — Ambulatory Visit
Admission: RE | Admit: 2022-05-07 | Discharge: 2022-05-07 | Disposition: A | Discharge status: Home / Self Care | Payer: Medicare Other | Source: Ambulatory Visit | Attending: Physician Assistant | Admitting: Physician Assistant

## 2022-05-07 ENCOUNTER — Other Ambulatory Visit: Payer: Self-pay | Admitting: Physician Assistant

## 2022-05-07 DIAGNOSIS — Z03821 Encounter for observation for suspected ingested foreign body ruled out: Secondary | ICD-10-CM

## 2022-05-07 DIAGNOSIS — T189XXA Foreign body of alimentary tract, part unspecified, initial encounter: Secondary | ICD-10-CM | POA: Diagnosis not present

## 2022-05-07 DIAGNOSIS — M5134 Other intervertebral disc degeneration, thoracic region: Secondary | ICD-10-CM | POA: Diagnosis not present

## 2022-05-07 DIAGNOSIS — T18108A Unspecified foreign body in esophagus causing other injury, initial encounter: Secondary | ICD-10-CM | POA: Diagnosis not present

## 2022-05-07 DIAGNOSIS — R062 Wheezing: Secondary | ICD-10-CM

## 2022-06-08 DIAGNOSIS — J40 Bronchitis, not specified as acute or chronic: Secondary | ICD-10-CM | POA: Diagnosis not present

## 2022-06-08 DIAGNOSIS — H6123 Impacted cerumen, bilateral: Secondary | ICD-10-CM | POA: Diagnosis not present

## 2022-07-02 DIAGNOSIS — H6123 Impacted cerumen, bilateral: Secondary | ICD-10-CM | POA: Diagnosis not present

## 2022-09-28 ENCOUNTER — Ambulatory Visit
Admission: EM | Admit: 2022-09-28 | Discharge: 2022-09-28 | Disposition: A | Discharge status: Home / Self Care | Payer: Medicare Other | Attending: Physician Assistant | Admitting: Physician Assistant

## 2022-09-28 DIAGNOSIS — Z20828 Contact with and (suspected) exposure to other viral communicable diseases: Secondary | ICD-10-CM | POA: Diagnosis not present

## 2022-09-28 DIAGNOSIS — Z1152 Encounter for screening for COVID-19: Secondary | ICD-10-CM | POA: Insufficient documentation

## 2022-09-28 DIAGNOSIS — J029 Acute pharyngitis, unspecified: Secondary | ICD-10-CM

## 2022-09-28 LAB — RESP PANEL BY RT-PCR (RSV, FLU A&B, COVID)  RVPGX2
Influenza A by PCR: NEGATIVE
Influenza B by PCR: NEGATIVE
Resp Syncytial Virus by PCR: NEGATIVE
SARS Coronavirus 2 by RT PCR: NEGATIVE

## 2022-09-28 LAB — POCT RAPID STREP A (OFFICE): Rapid Strep A Screen: NEGATIVE

## 2022-09-28 NOTE — ED Triage Notes (Signed)
Pt c/o sore throat and headache since ~ Friday not relieved with otc cough drops.

## 2022-09-28 NOTE — Discharge Instructions (Signed)
Strep screening negative.   Throat culture ordered as well as screening for covid, RSV, and influenza.   Recommend OTC symptomatic treatment while awaiting results and follow up with any further concerns.

## 2022-09-28 NOTE — ED Provider Notes (Signed)
[facility]    CSN: [id number] Arrival date & time: 09/28/22  0810      History   Chief Complaint Chief Complaint  Patient presents with   Sore Throat    HPI Randy Wise is a 76 y.o. male.   Patient here today for evaluation of sore throat that he has had the last 3 days. He reports headache and body aches as well. He has not had cough or ear pain. He denies any nausea or vomiting but has had diarrhea. He has not measured temperature. He has taken multiple OTC meds without significant relief. It is reported he has had exposure to RSV (telephone call from wife).   The history is provided by the patient.    Past Medical History:  Diagnosis Date   CAD (coronary artery disease) 03/30/11   Mild nonobstructive disease per cath   GERD (gastroesophageal reflux disease)    HTN (hypertension)    Hyperlipidemia    Palpitations    Prostatitis    Symptomatic PVCs     Patient Active Problem List   Diagnosis Date Noted   Symptomatic PVCs 11/16/2011   CAD (coronary artery disease) 04/16/2011   GERD (gastroesophageal reflux disease)    Hyperlipidemia    Chest pain    HTN (hypertension)     Past Surgical History:  Procedure Laterality Date   APPENDECTOMY     CARDIAC CATHETERIZATION  April 2012   mild nonobstructive CAD with normal LV function. Left ventricular angiogram was performed in the RAO projection and showed normal left ventricular systolic function with ejection fraction of 65%   fistula removal     Nuclear Stress Test  2009   Ef-63%, Normal   TONSILLECTOMY     US ECHOCARDIOGRAPHY  2009   Normal, EF-65%       Home Medications    Prior to Admission medications   Medication Sig Start Date End Date Taking? Authorizing Provider  alum & mag hydroxide-simeth (MAALOX MAX) 400-400-40 MG/5ML suspension Take 10 mLs by mouth every 6 (six) hours as needed for indigestion. 06/05/21   Fatima Blank, MD  atorvastatin (LIPITOR) 10 MG tablet Take 1  tablet (10 mg total) by mouth daily. Patient not taking: Reported on 06/05/2021 04/16/11 04/15/12  Burtis Junes, NP  cholecalciferol (VITAMIN D) 25 MCG (1000 UNIT) tablet Take 1,000 Units by mouth daily.    [provider]  fish oil-omega-3 fatty acids 1000 MG capsule Take 1 g by mouth daily.    [provider]  lidocaine (XYLOCAINE) 2 % solution Use as directed 15 mLs in the mouth or throat every 6 (six) hours as needed (stomach pain). 06/05/21   Fatima Blank, MD  metoprolol succinate (TOPROL XL) 25 MG 24 hr tablet Take 1 tablet (25 mg total) by mouth daily. Patient not taking: Reported on 06/05/2021 10/19/12 10/19/13  Darlin Coco, MD  nitroGLYCERIN (NITROSTAT) 0.4 MG SL tablet Place 0.4 mg under the tongue every 5 (five) minutes as needed.      [provider]  omeprazole (PRILOSEC OTC) 20 MG tablet Take 20 mg by mouth daily.      [provider]  Potassium Bicarbonate 99 MG CAPS Take 99 mg by mouth daily.    [provider]  saw palmetto 500 MG capsule Take 500 mg by mouth daily.    [provider]    Family History Family History  Problem Relation Age of Onset   Hypertension Brother  Heart disease Father    Heart attack Father     Social History Social History   Tobacco Use   Smoking status: Never   Smokeless tobacco: Never  Substance Use Topics   Alcohol use: No   Drug use: No     Allergies   Patient has no known allergies.   Review of Systems Review of Systems  Constitutional:  Negative for fever.  HENT:  Positive for congestion and sore throat. Negative for ear pain.   Eyes:  Negative for discharge and redness.  Respiratory:  Negative for cough and shortness of breath.   Gastrointestinal:  Positive for diarrhea. Negative for abdominal pain, nausea and vomiting.  Musculoskeletal:  Positive for myalgias.  Neurological:  Positive for headaches.     Physical Exam Triage Vital Signs ED Triage  Vitals  Enc Vitals Group     BP      Pulse      Resp      Temp      Temp src      SpO2      Weight      Height      Head Circumference      Peak Flow      Pain Score      Pain Loc      Pain Edu?      Excl. in [place]?    No data found.  Updated Vital Signs BP (!) 147/87 (BP Location: Left Arm)   Pulse 75   Temp 98 F (36.7 C) (Oral)   Resp 18   SpO2 96%      Physical Exam Vitals and nursing note reviewed.  Constitutional:      General: He is not in acute distress.    Appearance: Normal appearance. He is not ill-appearing.  HENT:     Head: Normocephalic and atraumatic.     Right Ear: There is impacted cerumen.     Left Ear: There is impacted cerumen.     Nose: Nose normal. No congestion.     Mouth/Throat:     Mouth: Mucous membranes are moist.     Pharynx: Oropharynx is clear. Posterior oropharyngeal erythema present. No oropharyngeal exudate.  Eyes:     Conjunctiva/sclera: Conjunctivae normal.  Cardiovascular:     Rate and Rhythm: Normal rate and regular rhythm.     Heart sounds: Normal heart sounds. No murmur heard. Pulmonary:     Effort: Pulmonary effort is normal. No respiratory distress.     Breath sounds: Normal breath sounds. No wheezing, rhonchi or rales.  Skin:    General: Skin is warm and dry.  Neurological:     Mental Status: He is alert.  Psychiatric:        Mood and Affect: Mood normal.        Thought Content: Thought content normal.      UC Treatments / Results  Labs (all labs ordered are listed, but only abnormal results are displayed) Labs Reviewed  CULTURE, GROUP A STREP ([place])  RESP PANEL BY RT-PCR (RSV, FLU A&B, COVID)  [id number]  POCT RAPID STREP A (OFFICE)    EKG   Radiology No results found.  Procedures Procedures (including critical care time)  Medications Ordered in UC Medications - No data to display  Initial Impression / Assessment and Plan / UC Course  I have reviewed the triage vital signs and the nursing  notes.  Pertinent labs & imaging results that were available during my care of the  patient were reviewed by me and considered in my medical decision making (see chart for details).    Strep screening negative. Suspect most likely viral etiology of symptoms. Will screen for covid, flu and RSV given exposure. Encouraged symptomatic treatment and follow up with any further concerns.   Final Clinical Impressions(s) / UC Diagnoses   Final diagnoses:  Acute pharyngitis, unspecified etiology  Encounter for screening for COVID-19  RSV exposure     Discharge Instructions      Strep screening negative.   Throat culture ordered as well as screening for covid, RSV, and influenza.   Recommend OTC symptomatic treatment while awaiting results and follow up with any further concerns.      ED Prescriptions   None    PDMP not reviewed this encounter.   Francene Finders, PA-C 09/28/22 1023

## 2022-10-01 LAB — CULTURE, GROUP A STREP (THRC)

## 2022-10-28 DIAGNOSIS — J069 Acute upper respiratory infection, unspecified: Secondary | ICD-10-CM | POA: Diagnosis not present

## 2022-10-28 DIAGNOSIS — R059 Cough, unspecified: Secondary | ICD-10-CM | POA: Diagnosis not present

## 2022-10-28 DIAGNOSIS — Z03818 Encounter for observation for suspected exposure to other biological agents ruled out: Secondary | ICD-10-CM | POA: Diagnosis not present

## 2022-10-29 ENCOUNTER — Ambulatory Visit
Admission: RE | Admit: 2022-10-29 | Discharge: 2022-10-29 | Disposition: A | Discharge status: Home / Self Care | Payer: Medicare Other | Source: Ambulatory Visit | Attending: Internal Medicine | Admitting: Internal Medicine

## 2022-10-29 ENCOUNTER — Other Ambulatory Visit: Payer: Self-pay | Admitting: Internal Medicine

## 2022-10-29 DIAGNOSIS — R059 Cough, unspecified: Secondary | ICD-10-CM

## 2022-10-29 DIAGNOSIS — R0602 Shortness of breath: Secondary | ICD-10-CM | POA: Diagnosis not present

## 2022-11-05 DIAGNOSIS — E785 Hyperlipidemia, unspecified: Secondary | ICD-10-CM | POA: Diagnosis not present

## 2022-11-05 DIAGNOSIS — I493 Ventricular premature depolarization: Secondary | ICD-10-CM | POA: Diagnosis not present

## 2022-11-05 DIAGNOSIS — Z1331 Encounter for screening for depression: Secondary | ICD-10-CM | POA: Diagnosis not present

## 2022-11-05 DIAGNOSIS — K219 Gastro-esophageal reflux disease without esophagitis: Secondary | ICD-10-CM | POA: Diagnosis not present

## 2022-11-05 DIAGNOSIS — Z23 Encounter for immunization: Secondary | ICD-10-CM | POA: Diagnosis not present

## 2022-11-05 DIAGNOSIS — Z0000 Encounter for general adult medical examination without abnormal findings: Secondary | ICD-10-CM | POA: Diagnosis not present

## 2022-11-05 DIAGNOSIS — N400 Enlarged prostate without lower urinary tract symptoms: Secondary | ICD-10-CM | POA: Diagnosis not present

## 2022-11-12 DIAGNOSIS — D225 Melanocytic nevi of trunk: Secondary | ICD-10-CM | POA: Diagnosis not present

## 2022-11-12 DIAGNOSIS — L918 Other hypertrophic disorders of the skin: Secondary | ICD-10-CM | POA: Diagnosis not present

## 2022-11-12 DIAGNOSIS — L814 Other melanin hyperpigmentation: Secondary | ICD-10-CM | POA: Diagnosis not present

## 2022-11-12 DIAGNOSIS — L821 Other seborrheic keratosis: Secondary | ICD-10-CM | POA: Diagnosis not present

## 2023-01-29 DIAGNOSIS — H43813 Vitreous degeneration, bilateral: Secondary | ICD-10-CM | POA: Diagnosis not present

## 2023-01-29 DIAGNOSIS — H2513 Age-related nuclear cataract, bilateral: Secondary | ICD-10-CM | POA: Diagnosis not present

## 2023-02-22 IMAGING — CR DG CHEST 2V
2 series · 2 of 2 positions shown · non-contrast
Comparison: 05/30/2021

CLINICAL DATA: Foreign body, chicken bone stuck in throat, wheezing

EXAM:
CHEST - 2 VIEW

[w chest pa]
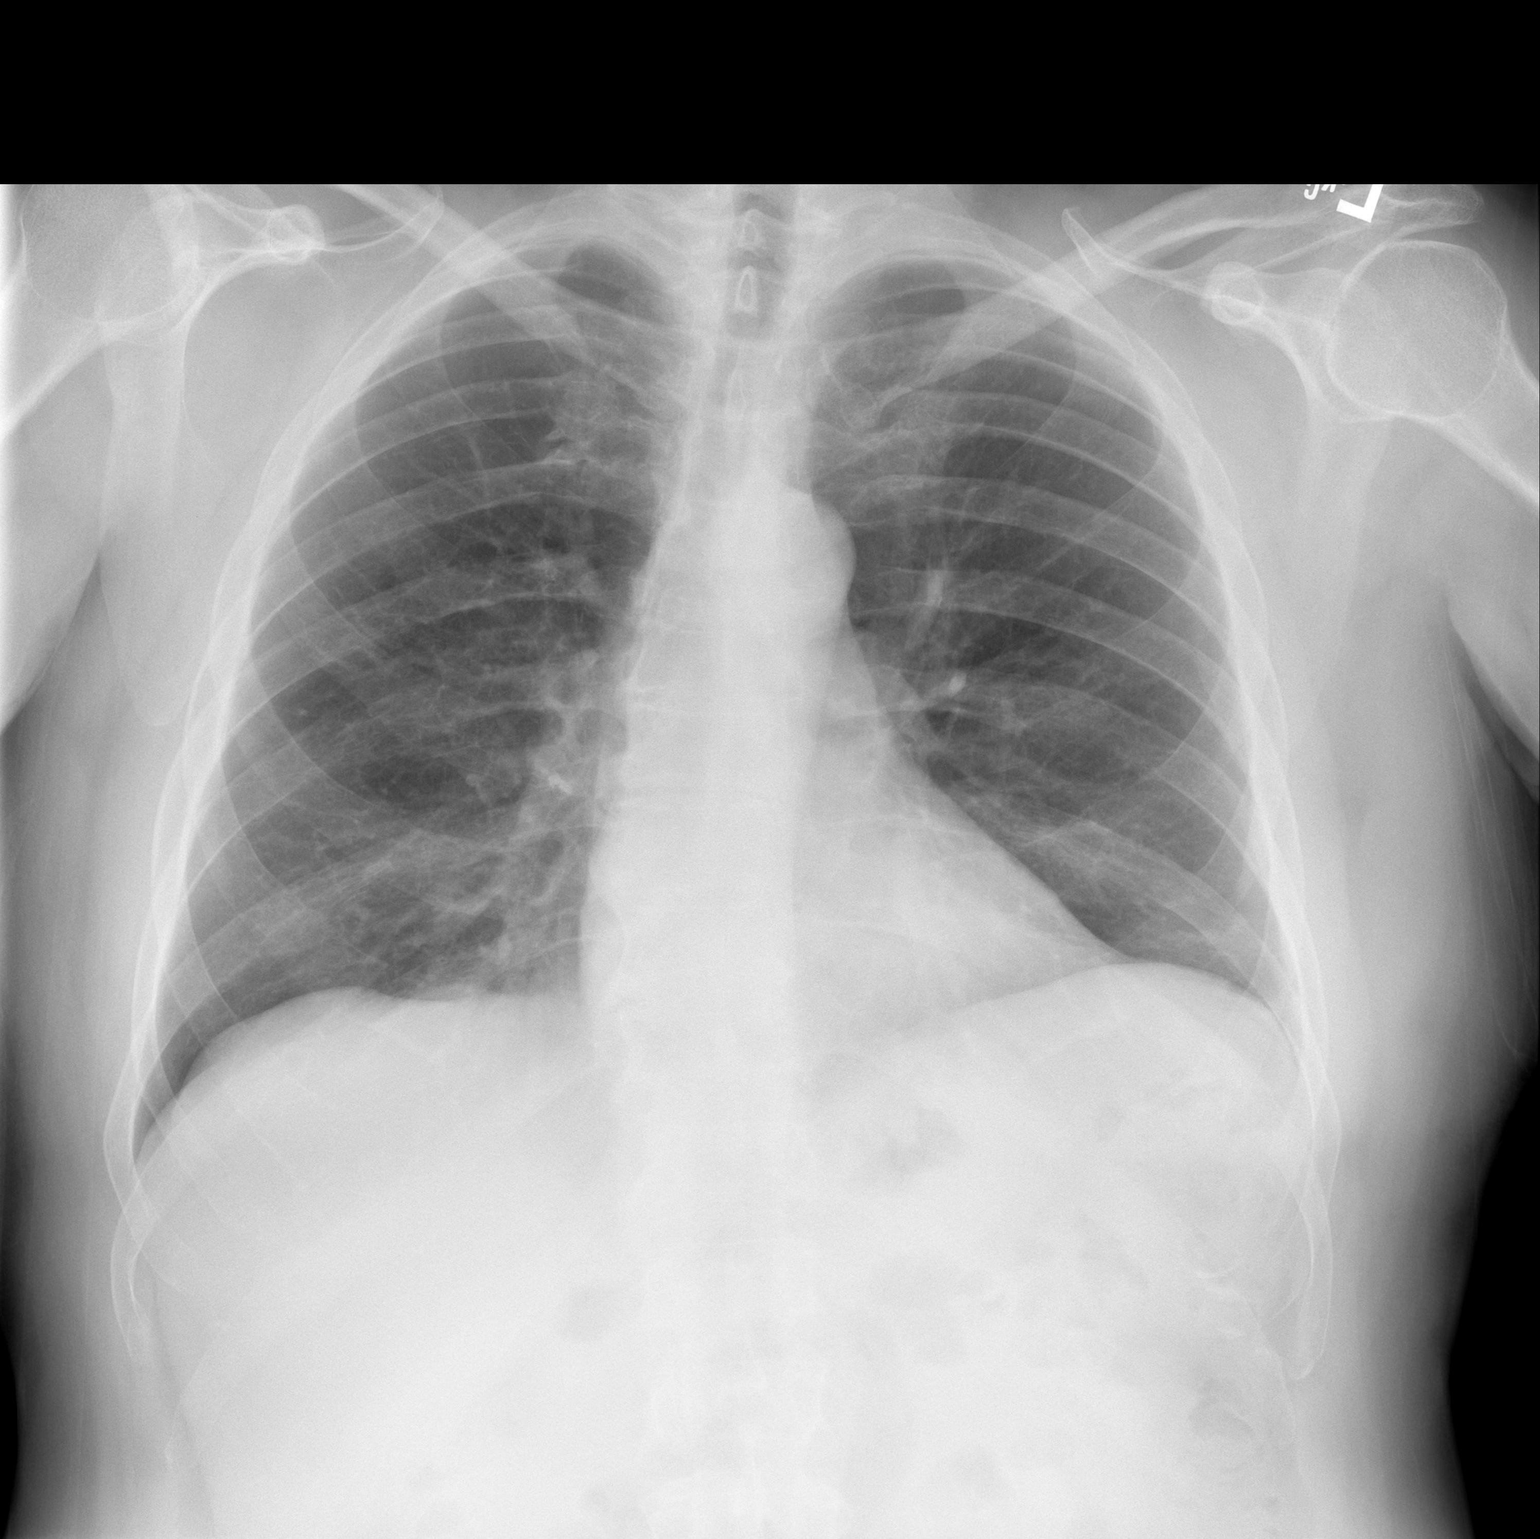

[w chest lat]
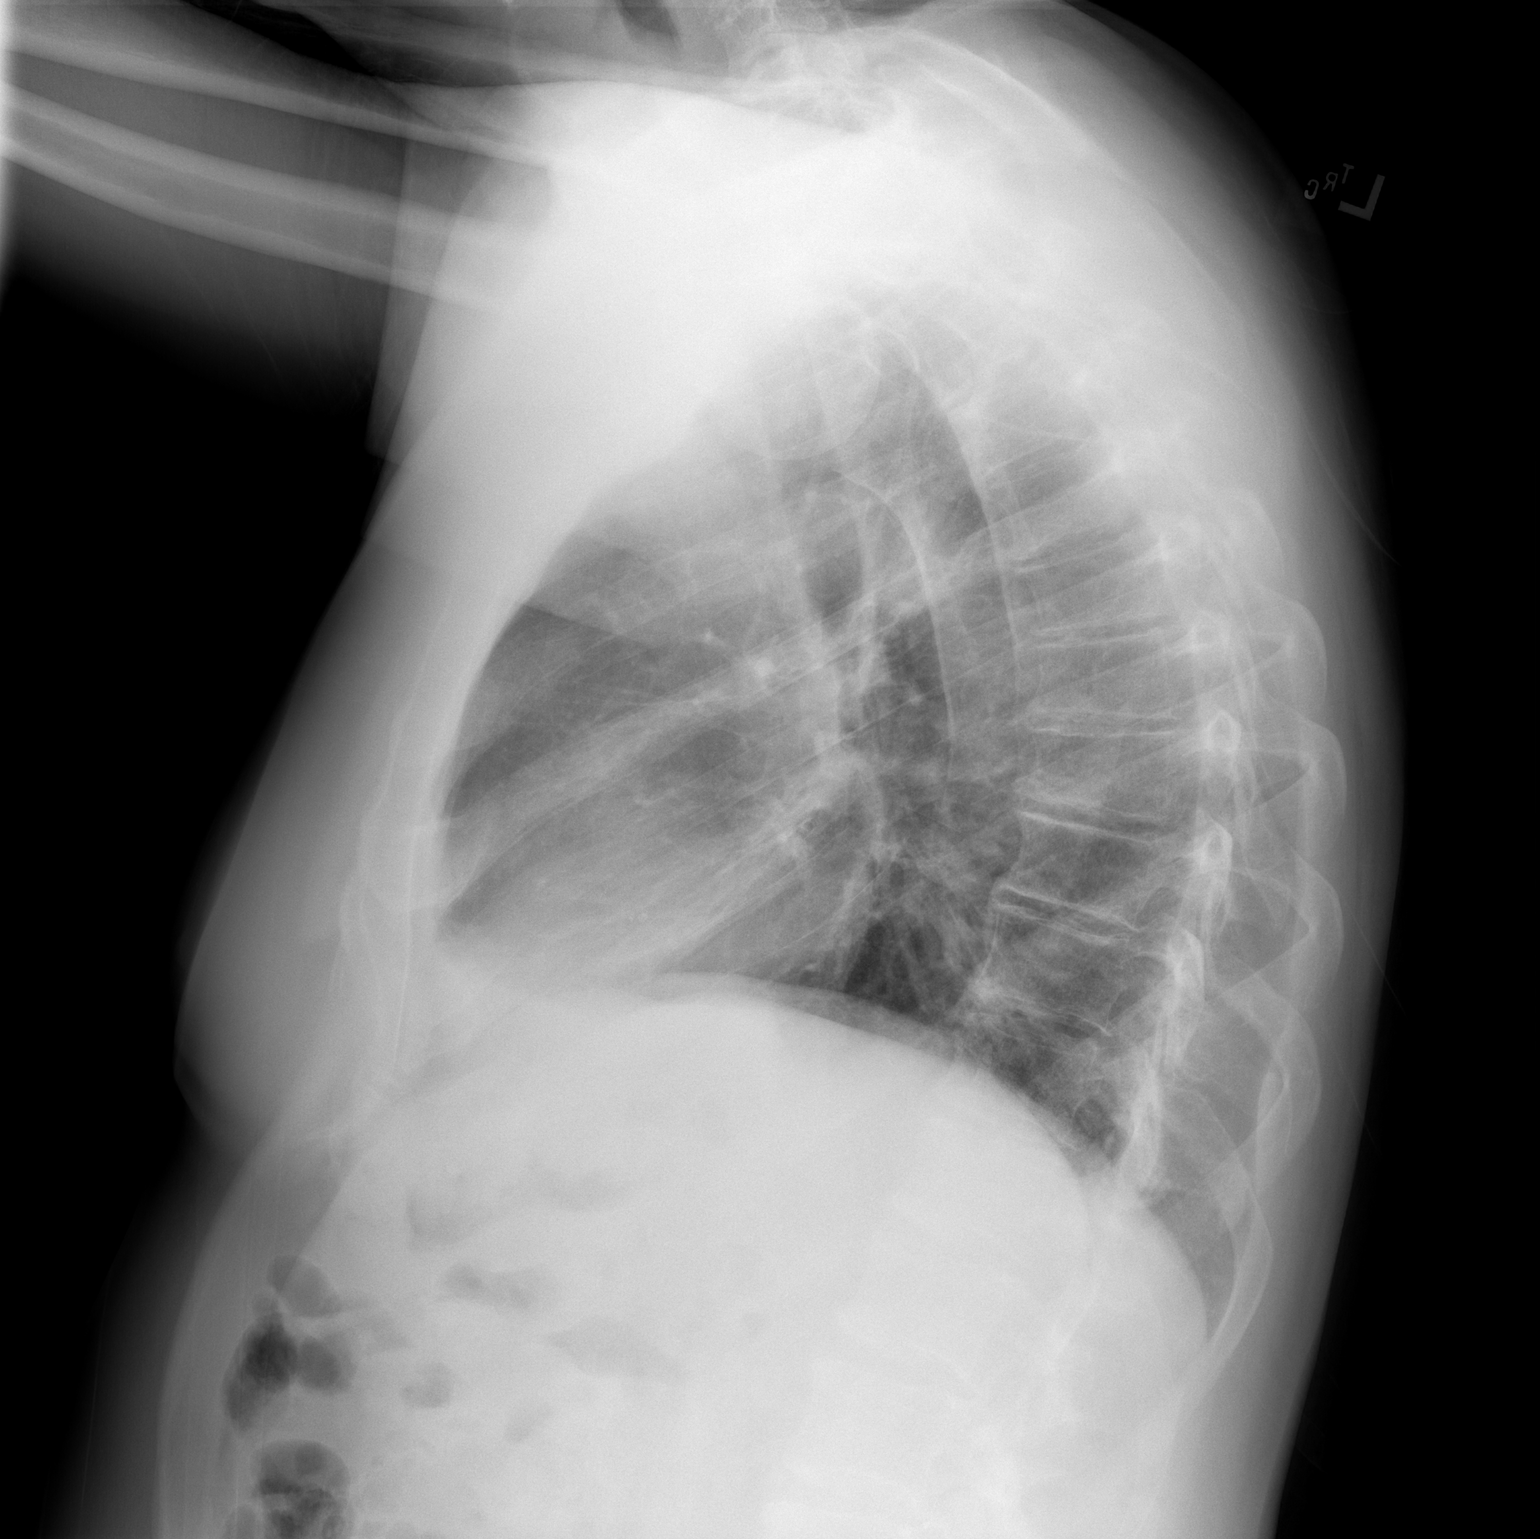

[2 of 2 positions shown; findings below may reference images not displayed]

FINDINGS: The heart size and mediastinal contours are within normal limits.
Both lungs are clear. Disc degenerative disease of the thoracic
spine.
IMPRESSION: No acute abnormality of the lungs. No acute abnormality of the
lungs. No radiopaque foreign body projecting over the expected
course of the esophagus or trachea. Please note that ingested food
material is often insufficiently radiopaque for identification by
radiographs.

## 2023-02-22 IMAGING — CR DG NECK SOFT TISSUE
2 series · 2 of 2 positions shown · non-contrast
Comparison: No comparison studies available.

CLINICAL DATA: Chicken bone stuck in throat on [REDACTED].

EXAM:
NECK SOFT TISSUES - 1+ VIEW

[w soft tissue neck *]
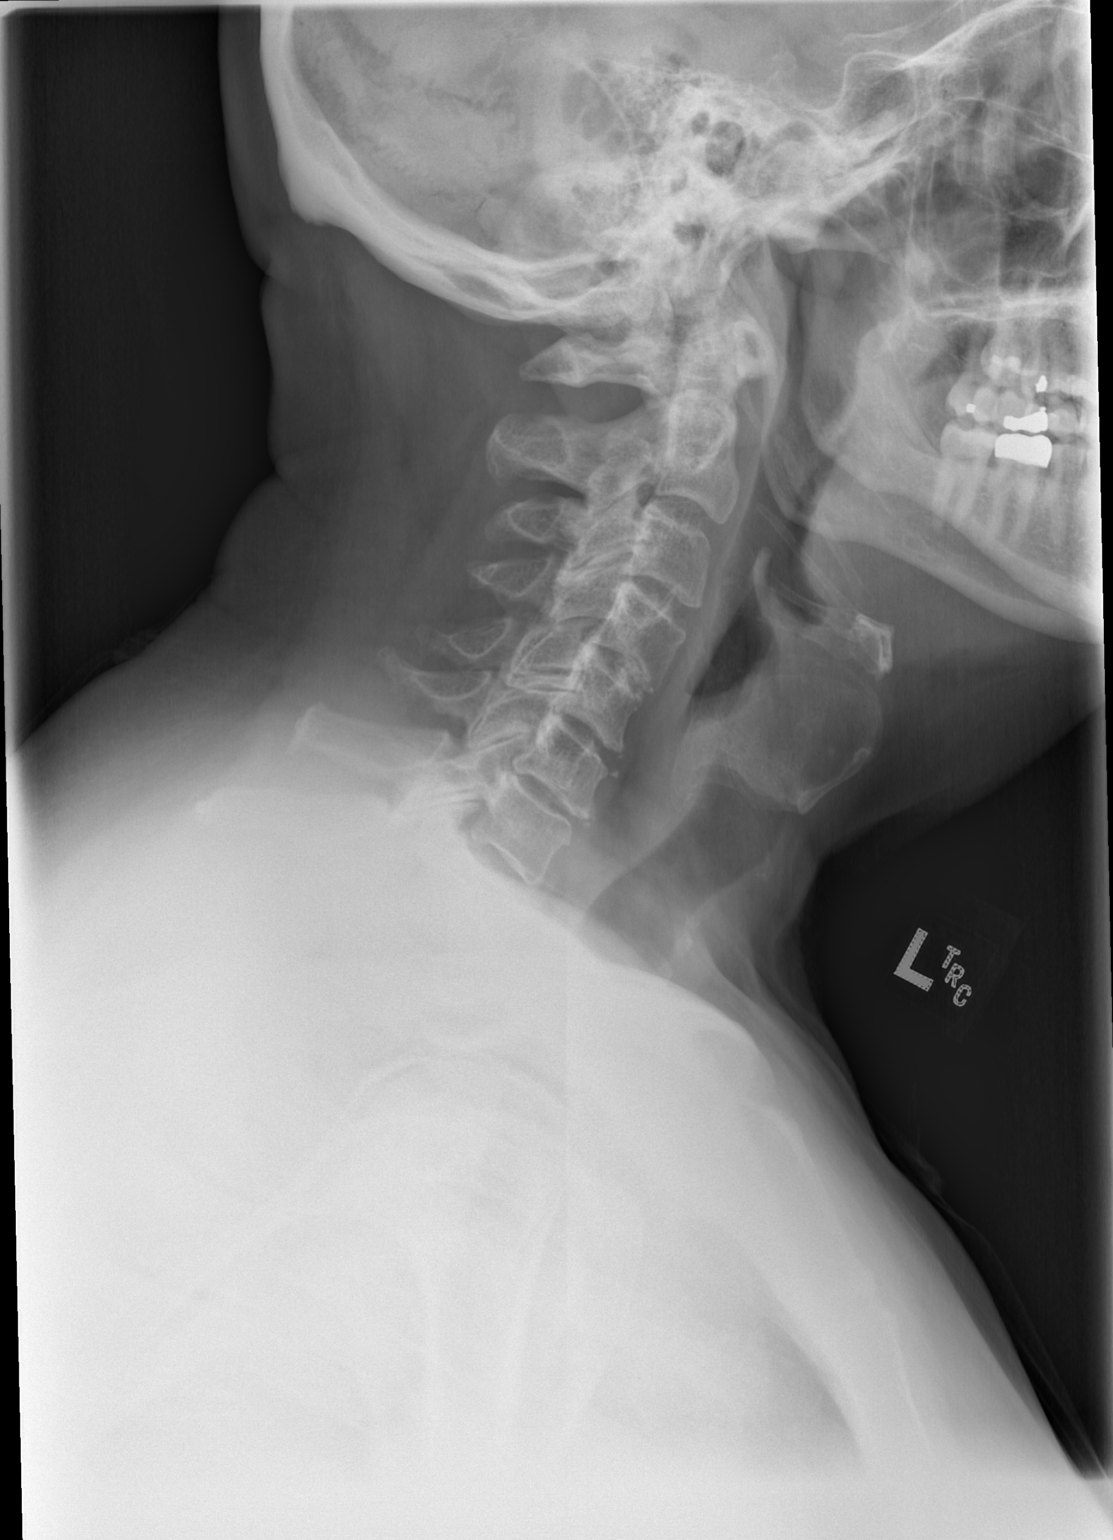

[w soft tissue neck ap *]
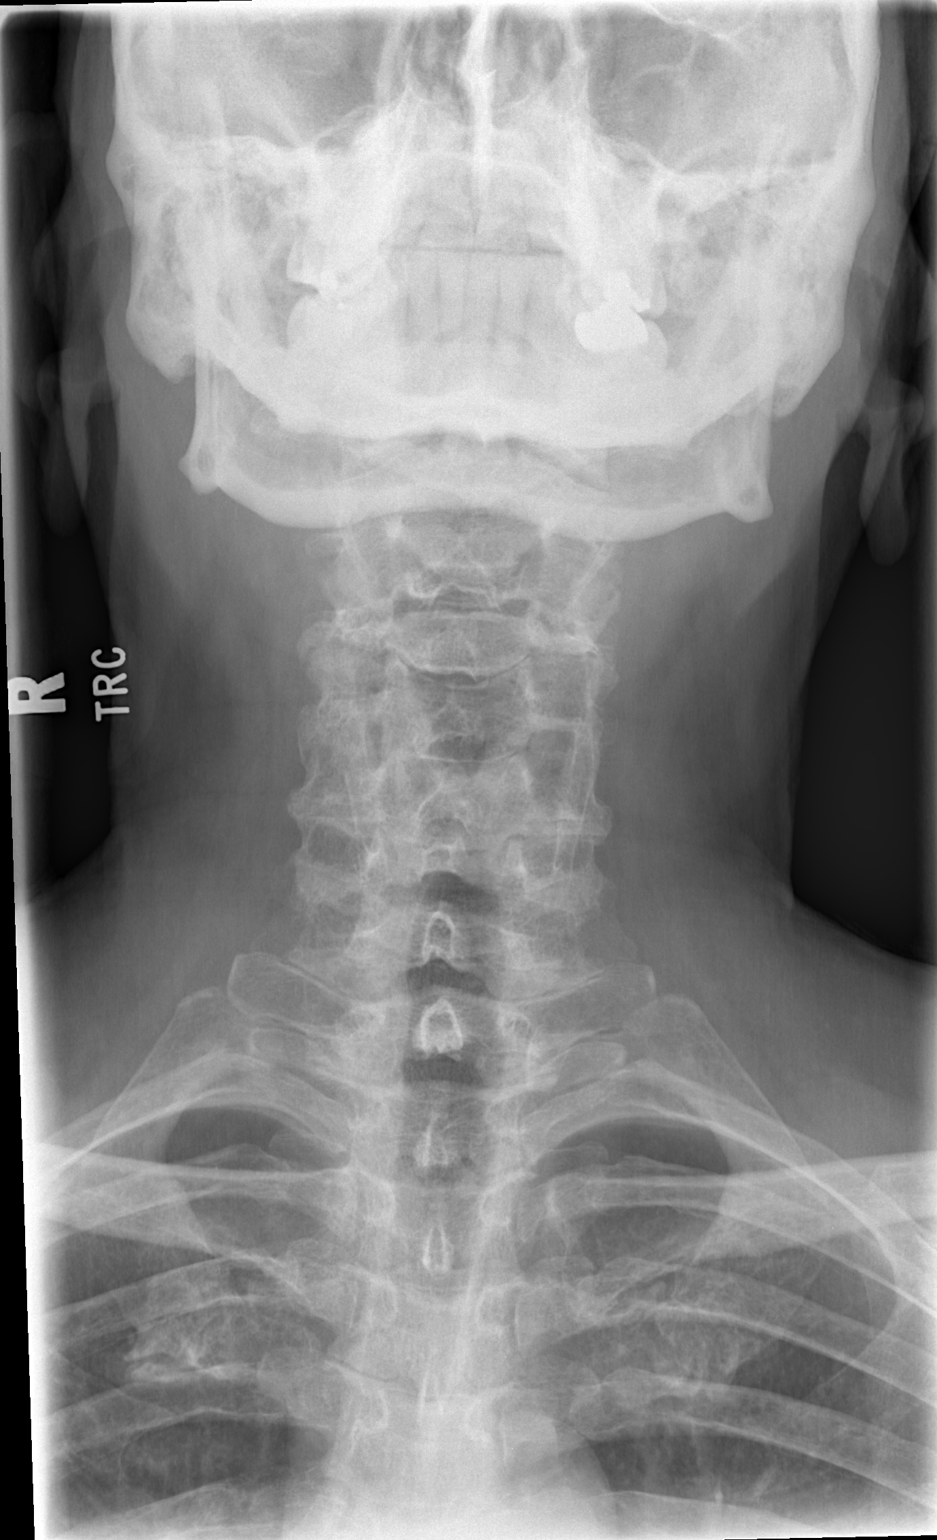

[2 of 2 positions shown; findings below may reference images not displayed]

FINDINGS: Prominent bilateral styloid process noted with ossification of both
stylohyoid ligaments. No evidence for radiopaque foreign body within
the hypopharynx or proximal esophagus. No gas in the prevertebral
soft tissues to suggest abscess. No prevertebral soft tissue
swelling.
IMPRESSION: 1. No evidence for radiopaque foreign body within the hypopharynx or
proximal cervical esophagus. No prevertebral soft tissue swelling
and no evidence for gas in the prevertebral soft tissues.
2. Prominent bilateral styloid process with ossification of both
stylohyoid ligaments.

## 2023-11-10 DIAGNOSIS — Z23 Encounter for immunization: Secondary | ICD-10-CM | POA: Diagnosis not present

## 2023-11-10 DIAGNOSIS — H6123 Impacted cerumen, bilateral: Secondary | ICD-10-CM | POA: Diagnosis not present

## 2023-11-10 DIAGNOSIS — K219 Gastro-esophageal reflux disease without esophagitis: Secondary | ICD-10-CM | POA: Diagnosis not present

## 2023-11-10 DIAGNOSIS — I493 Ventricular premature depolarization: Secondary | ICD-10-CM | POA: Diagnosis not present

## 2023-11-10 DIAGNOSIS — Z0000 Encounter for general adult medical examination without abnormal findings: Secondary | ICD-10-CM | POA: Diagnosis not present

## 2023-11-10 DIAGNOSIS — E785 Hyperlipidemia, unspecified: Secondary | ICD-10-CM | POA: Diagnosis not present

## 2023-11-10 DIAGNOSIS — N400 Enlarged prostate without lower urinary tract symptoms: Secondary | ICD-10-CM | POA: Diagnosis not present

## 2023-11-10 DIAGNOSIS — Z1331 Encounter for screening for depression: Secondary | ICD-10-CM | POA: Diagnosis not present

## 2023-11-15 DIAGNOSIS — L814 Other melanin hyperpigmentation: Secondary | ICD-10-CM | POA: Diagnosis not present

## 2023-11-15 DIAGNOSIS — L821 Other seborrheic keratosis: Secondary | ICD-10-CM | POA: Diagnosis not present

## 2023-11-15 DIAGNOSIS — L2989 Other pruritus: Secondary | ICD-10-CM | POA: Diagnosis not present

## 2023-11-15 DIAGNOSIS — D225 Melanocytic nevi of trunk: Secondary | ICD-10-CM | POA: Diagnosis not present

## 2023-11-15 DIAGNOSIS — L820 Inflamed seborrheic keratosis: Secondary | ICD-10-CM | POA: Diagnosis not present

## 2023-12-09 DIAGNOSIS — H6123 Impacted cerumen, bilateral: Secondary | ICD-10-CM | POA: Diagnosis not present

## 2024-02-08 DIAGNOSIS — H43813 Vitreous degeneration, bilateral: Secondary | ICD-10-CM | POA: Diagnosis not present

## 2024-02-08 DIAGNOSIS — H16143 Punctate keratitis, bilateral: Secondary | ICD-10-CM | POA: Diagnosis not present

## 2024-02-08 DIAGNOSIS — H16223 Keratoconjunctivitis sicca, not specified as Sjogren's, bilateral: Secondary | ICD-10-CM | POA: Diagnosis not present

## 2024-02-08 DIAGNOSIS — H2513 Age-related nuclear cataract, bilateral: Secondary | ICD-10-CM | POA: Diagnosis not present

## 2024-11-15 DIAGNOSIS — Z0000 Encounter for general adult medical examination without abnormal findings: Secondary | ICD-10-CM | POA: Diagnosis not present

## 2024-11-15 DIAGNOSIS — I493 Ventricular premature depolarization: Secondary | ICD-10-CM | POA: Diagnosis not present

## 2024-11-15 DIAGNOSIS — E785 Hyperlipidemia, unspecified: Secondary | ICD-10-CM | POA: Diagnosis not present

## 2024-11-15 DIAGNOSIS — Z1331 Encounter for screening for depression: Secondary | ICD-10-CM | POA: Diagnosis not present

## 2024-11-15 DIAGNOSIS — N400 Enlarged prostate without lower urinary tract symptoms: Secondary | ICD-10-CM | POA: Diagnosis not present

## 2024-12-05 ENCOUNTER — Emergency Department (HOSPITAL_COMMUNITY)

## 2024-12-05 ENCOUNTER — Other Ambulatory Visit: Payer: Self-pay

## 2024-12-05 ENCOUNTER — Encounter (HOSPITAL_COMMUNITY): Payer: Self-pay

## 2024-12-05 ENCOUNTER — Emergency Department (HOSPITAL_COMMUNITY)
Admission: EM | Admit: 2024-12-05 | Discharge: 2024-12-06 | Disposition: A | Discharge status: Home / Self Care | Attending: Emergency Medicine | Admitting: Emergency Medicine

## 2024-12-05 DIAGNOSIS — I10 Essential (primary) hypertension: Secondary | ICD-10-CM | POA: Diagnosis not present

## 2024-12-05 DIAGNOSIS — R1013 Epigastric pain: Secondary | ICD-10-CM | POA: Diagnosis present

## 2024-12-05 DIAGNOSIS — I2510 Atherosclerotic heart disease of native coronary artery without angina pectoris: Secondary | ICD-10-CM | POA: Insufficient documentation

## 2024-12-05 LAB — BASIC METABOLIC PANEL WITH GFR
Anion gap: 9 (ref 5–15)
BUN: 13 mg/dL (ref 8–23)
CO2: 26 mmol/L (ref 22–32)
Calcium: 9.5 mg/dL (ref 8.9–10.3)
Chloride: 104 mmol/L (ref 98–111)
Creatinine, Ser: 1.04 mg/dL (ref 0.61–1.24)
GFR, Estimated: >60 mL/min
Glucose, Bld: 126 mg/dL — ABNORMAL HIGH (ref 70–99)
Potassium: 4.2 mmol/L (ref 3.5–5.1)
Sodium: 139 mmol/L (ref 135–145)

## 2024-12-05 LAB — CBC
HCT: 46.1 % (ref 39.0–52.0)
Hemoglobin: 15.5 g/dL (ref 13.0–17.0)
MCH: 30.9 pg (ref 26.0–34.0)
MCHC: 33.6 g/dL (ref 30.0–36.0)
MCV: 91.8 fL (ref 80.0–100.0)
Platelets: 196 K/uL (ref 150–400)
RBC: 5.02 MIL/uL (ref 4.22–5.81)
RDW: 12.8 % (ref 11.5–15.5)
WBC: 5 K/uL (ref 4.0–10.5)
nRBC: 0 % (ref 0.0–0.2)

## 2024-12-05 LAB — TROPONIN T, HIGH SENSITIVITY: Troponin T High Sensitivity: <15 ng/L (ref 0–19)

## 2024-12-05 NOTE — ED Triage Notes (Signed)
 Pt reports that he was on the treadmill this morning and had 3 sharp pain in his chest and is now having a tightness in his upper abdomen. Pt states that he just does not feel well.

## 2024-12-06 LAB — HEPATIC FUNCTION PANEL
ALT: 24 U/L (ref 0–44)
AST: 48 U/L — ABNORMAL HIGH (ref 15–41)
Albumin: 4.3 g/dL (ref 3.5–5.0)
Alkaline Phosphatase: 93 U/L (ref 38–126)
Bilirubin, Direct: 0.2 mg/dL (ref 0.0–0.2)
Indirect Bilirubin: 0.3 mg/dL (ref 0.3–0.9)
Total Bilirubin: 0.5 mg/dL (ref 0.0–1.2)
Total Protein: 6.9 g/dL (ref 6.5–8.1)

## 2024-12-06 LAB — LIPASE, BLOOD: Lipase: 32 U/L (ref 11–51)

## 2024-12-06 LAB — TROPONIN T, HIGH SENSITIVITY: Troponin T High Sensitivity: <15 ng/L (ref 0–19)

## 2024-12-06 MED ORDER — SUCRALFATE 1 G PO TABS: 1.0000 g | ORAL_TABLET | Freq: Four times a day (QID) | ORAL | 0 refills | Status: AC | PRN

## 2024-12-06 NOTE — ED Provider Notes (Signed)
 " [facility] Emergency Department Provider Note MRN:  [id number]  Arrival date & time: 12/06/2024     Chief Complaint   Chest Pain   History of Present Illness   Randy Wise is a 79 y.o. year-old male with a history of hyperlipidemia, GERD presenting to the ED with chief complaint of chest pain.  Pain in the epigastrium, a pinpoint location right under the xiphoid process.  Started hurting while on the treadmill walking, has been constant since then.  Described as a deep constant pain in this area.  Denies any pain higher up in the chest, no trouble breathing, no dizziness or sweatiness.  No other complaints.  Review of Systems  A thorough review of systems was obtained and all systems are negative except as noted in the HPI and PMH.   Patient's Health History    Past Medical History:  Diagnosis Date   CAD (coronary artery disease) 03/30/11   Mild nonobstructive disease per cath   GERD (gastroesophageal reflux disease)    HTN (hypertension)    Hyperlipidemia    Palpitations    Prostatitis    Symptomatic PVCs     Past Surgical History:  Procedure Laterality Date   APPENDECTOMY     CARDIAC CATHETERIZATION  April 2012   mild nonobstructive CAD with normal LV function. Left ventricular angiogram was performed in the RAO projection and showed normal left ventricular systolic function with ejection fraction of 65%   fistula removal     Nuclear Stress Test  2009   Ef-63%, Normal   TONSILLECTOMY     US  ECHOCARDIOGRAPHY  2009   Normal, EF-65%    Family History  Problem Relation Age of Onset   Hypertension Brother    Heart disease Father    Heart attack Father     Social History   Socioeconomic History   Marital status: Married    Spouse name: Not on file   Number of children: Not on file   Years of education: Not on file   Highest education level: Not on file  Occupational History   Not on file  Tobacco Use   Smoking status: Never    Smokeless tobacco: Never  Substance and Sexual Activity   Alcohol use: No   Drug use: No   Sexual activity: Yes  Other Topics Concern   Not on file  Social History Narrative   Not on file   Social Drivers of Health   Tobacco Use: Low Risk (12/05/2024)   Patient History    Smoking Tobacco Use: Never    Smokeless Tobacco Use: Never    Passive Exposure: Not on file  Financial Resource Strain: Not on file  Food Insecurity: Not on file  Transportation Needs: Not on file  Physical Activity: Not on file  Stress: Not on file  Social Connections: Not on file  Intimate Partner Violence: Not on file  Depression (EYV7-0): Not on file  Alcohol Screen: Not on file  Housing: Not on file  Utilities: Not on file  Health Literacy: Not on file     Physical Exam   Vitals:   12/05/24 2342 12/06/24 0111  BP: (!) 126/57 126/80  Pulse: 60 60  Resp: 16 16  Temp: 98.1 F (36.7 C)   SpO2: 97% 100%    CONSTITUTIONAL: Well-appearing, NAD NEURO/PSYCH:  Alert and oriented x 3, no focal deficits EYES:  eyes equal and reactive ENT/NECK:  no LAD, no JVD CARDIO: Regular rate, well-perfused, normal S1  and S2 PULM:  CTAB no wheezing or rhonchi GI/GU:  non-distended, non-tender MSK/SPINE:  No gross deformities, no edema SKIN:  no rash, atraumatic   *Additional and/or pertinent findings included in MDM below  Diagnostic and Interventional Summary    EKG Interpretation Date/Time:  Tuesday December 05 2024 19:10:21 EST Ventricular Rate:  81 PR Interval:  195 QRS Duration:  81 QT Interval:  361 QTC Calculation: 419 R Axis:   -8  Text Interpretation: Sinus rhythm Consider left atrial enlargement Low voltage, precordial leads Consider anterior infarct Confirmed by Theadore Sharper [phone]) on 12/06/2024 12:57:41 AM       Labs Reviewed  BASIC METABOLIC PANEL WITH GFR - Abnormal; Notable for the following components:      Result Value   Glucose, Bld 126 (*)    All other components within normal  limits  HEPATIC FUNCTION PANEL - Abnormal; Notable for the following components:   AST 48 (*)    All other components within normal limits  CBC  LIPASE, BLOOD  TROPONIN T, HIGH SENSITIVITY  TROPONIN T, HIGH SENSITIVITY    DG Chest 2 View  Final Result      Medications - No data to display   Procedures  /  Critical Care Procedures  ED Course and Medical Decision Making  Initial Impression and Ddx Patient well-appearing in no acute distress, abdomen soft and nontender with no rebound guarding or rigidity.  Lungs are clear, vitals reassuring, no increased work of breathing.  ACS is considered but MSK and/or GERD is favored.  History of GERD with the plan for endoscopy next week.  Has been taking his omeprazole.  Other considerations include biliary colic and pancreatitis, felt to be unlikely given the lack of tenderness.  Past medical/surgical history that increases complexity of ED encounter: Reported history of CAD but patient denies any heart problems in the past.  Interpretation of Diagnostics I personally reviewed the EKG and my interpretation is as follows: Sinus rhythm without concerning ischemic findings  No significant blood count or electrolyte disturbance.  Troponin negative x 2  Patient Reassessment and Ultimate Disposition/Management     Discharged with return precautions.  Patient management required discussion with the following services or consulting groups:  None  Complexity of Problems Addressed Acute illness or injury that poses threat of life of bodily function  Additional Data Reviewed and Analyzed Further history obtained from: Prior labs/imaging results  Additional Factors Impacting ED Encounter Risk Consideration of hospitalization  Sharper HERO. Theadore, MD [facility] Emergency Medicine [facility] [email or web address]  Final Clinical Impressions(s) / ED Diagnoses     ICD-10-CM   1. Epigastric pain  R10.13       ED Discharge  Orders          Ordered    sucralfate  (CARAFATE ) 1 g tablet  4 times daily PRN        12/06/24 0110             Discharge Instructions Discussed with and Provided to Patient:    Discharge Instructions      You were evaluated in the Emergency Department and after careful evaluation, we did not find any emergent condition requiring admission or further testing in the hospital.  Your exam/testing today is overall reassuring.  No signs of heart strain or damage.  Pain could be muscular or related to your acid reflux.  Continue your omeprazole.  Can try the Carafate  as needed as we discussed.  Keep your follow-up for  endoscopy.  Please return to the Emergency Department if you experience any worsening of your condition.   Thank you for allowing us  to be a part of your care.      Theadore Ozell HERO, MD 12/06/24 [phone]  "

## 2024-12-06 NOTE — Discharge Instructions (Signed)
 You were evaluated in the Emergency Department and after careful evaluation, we did not find any emergent condition requiring admission or further testing in the hospital.  Your exam/testing today is overall reassuring.  No signs of heart strain or damage.  Pain could be muscular or related to your acid reflux.  Continue your omeprazole.  Can try the Carafate  as needed as we discussed.  Keep your follow-up for endoscopy.  Please return to the Emergency Department if you experience any worsening of your condition.   Thank you for allowing us  to be a part of your care.
